# Patient Record
Sex: Female | Born: 1965 | Race: White | Hispanic: No | Marital: Married | Smoking: Never smoker
Health system: Southern US, Community
[De-identification: ages and names within clinical notes are randomized; demographics above are authoritative.]

## PROBLEM LIST (undated history)

## (undated) DIAGNOSIS — F32A Depression, unspecified: Secondary | ICD-10-CM

## (undated) DIAGNOSIS — F329 Major depressive disorder, single episode, unspecified: Secondary | ICD-10-CM

## (undated) DIAGNOSIS — N941 Unspecified dyspareunia: Secondary | ICD-10-CM

## (undated) DIAGNOSIS — E669 Obesity, unspecified: Secondary | ICD-10-CM

## (undated) DIAGNOSIS — I10 Essential (primary) hypertension: Secondary | ICD-10-CM

## (undated) DIAGNOSIS — O24419 Gestational diabetes mellitus in pregnancy, unspecified control: Secondary | ICD-10-CM

## (undated) DIAGNOSIS — D649 Anemia, unspecified: Secondary | ICD-10-CM

## (undated) DIAGNOSIS — R55 Syncope and collapse: Secondary | ICD-10-CM

## (undated) DIAGNOSIS — G43909 Migraine, unspecified, not intractable, without status migrainosus: Secondary | ICD-10-CM

## (undated) HISTORY — PX: DILATION AND CURETTAGE OF UTERUS: SHX78

## (undated) HISTORY — PX: ABDOMINAL HYSTERECTOMY: SHX81

## (undated) HISTORY — DX: Major depressive disorder, single episode, unspecified: F32.9

## (undated) HISTORY — DX: Anemia, unspecified: D64.9

## (undated) HISTORY — PX: LAPAROSCOPIC VAGINAL HYSTERECTOMY: SUR798

## (undated) HISTORY — DX: Unspecified dyspareunia: N94.10

## (undated) HISTORY — DX: Depression, unspecified: F32.A

## (undated) HISTORY — DX: Gestational diabetes mellitus in pregnancy, unspecified control: O24.419

---

## 1997-03-19 ENCOUNTER — Ambulatory Visit (HOSPITAL_COMMUNITY): Admission: RE | Admit: 1997-03-19 | Discharge: 1997-03-19 | Payer: Self-pay | Admitting: *Deleted

## 1997-06-17 ENCOUNTER — Ambulatory Visit (HOSPITAL_COMMUNITY): Admission: RE | Admit: 1997-06-17 | Discharge: 1997-06-17 | Payer: Self-pay | Admitting: Family Medicine

## 1997-06-26 ENCOUNTER — Encounter: Admission: RE | Admit: 1997-06-26 | Discharge: 1997-09-24 | Payer: Self-pay | Admitting: Neurosurgery

## 1999-04-21 ENCOUNTER — Other Ambulatory Visit: Admission: RE | Admit: 1999-04-21 | Discharge: 1999-04-21 | Payer: Self-pay | Admitting: Obstetrics and Gynecology

## 1999-08-28 ENCOUNTER — Encounter: Admission: RE | Admit: 1999-08-28 | Discharge: 1999-11-26 | Payer: Self-pay | Admitting: Physical Therapy

## 1999-10-15 ENCOUNTER — Inpatient Hospital Stay (HOSPITAL_COMMUNITY): Admission: AD | Admit: 1999-10-15 | Discharge: 1999-10-15 | Payer: Self-pay | Admitting: Obstetrics and Gynecology

## 1999-10-15 ENCOUNTER — Ambulatory Visit (HOSPITAL_COMMUNITY): Admission: RE | Admit: 1999-10-15 | Discharge: 1999-10-15 | Payer: Self-pay | Admitting: Obstetrics and Gynecology

## 1999-10-16 ENCOUNTER — Inpatient Hospital Stay (HOSPITAL_COMMUNITY): Admission: AD | Admit: 1999-10-16 | Discharge: 1999-10-19 | Payer: Self-pay | Admitting: Obstetrics and Gynecology

## 1999-10-16 ENCOUNTER — Encounter (INDEPENDENT_AMBULATORY_CARE_PROVIDER_SITE_OTHER): Payer: Self-pay | Admitting: Specialist

## 1999-11-30 ENCOUNTER — Other Ambulatory Visit: Admission: RE | Admit: 1999-11-30 | Discharge: 1999-11-30 | Payer: Self-pay | Admitting: Obstetrics and Gynecology

## 2000-05-31 ENCOUNTER — Encounter: Payer: Self-pay | Admitting: Obstetrics and Gynecology

## 2000-05-31 ENCOUNTER — Ambulatory Visit (HOSPITAL_COMMUNITY): Admission: RE | Admit: 2000-05-31 | Discharge: 2000-05-31 | Payer: Self-pay | Admitting: Obstetrics and Gynecology

## 2002-03-28 ENCOUNTER — Other Ambulatory Visit: Admission: RE | Admit: 2002-03-28 | Discharge: 2002-03-28 | Payer: Self-pay | Admitting: Obstetrics and Gynecology

## 2003-04-02 ENCOUNTER — Other Ambulatory Visit: Admission: RE | Admit: 2003-04-02 | Discharge: 2003-04-02 | Payer: Self-pay | Admitting: Obstetrics and Gynecology

## 2003-05-22 ENCOUNTER — Observation Stay (HOSPITAL_COMMUNITY): Admission: RE | Admit: 2003-05-22 | Discharge: 2003-05-23 | Payer: Self-pay | Admitting: Obstetrics and Gynecology

## 2003-05-22 ENCOUNTER — Encounter (INDEPENDENT_AMBULATORY_CARE_PROVIDER_SITE_OTHER): Payer: Self-pay | Admitting: Specialist

## 2004-09-01 ENCOUNTER — Ambulatory Visit (HOSPITAL_COMMUNITY): Admission: RE | Admit: 2004-09-01 | Discharge: 2004-09-01 | Payer: Self-pay | Admitting: *Deleted

## 2005-03-31 ENCOUNTER — Encounter: Payer: Self-pay | Admitting: Obstetrics and Gynecology

## 2006-04-13 ENCOUNTER — Ambulatory Visit (HOSPITAL_COMMUNITY): Admission: RE | Admit: 2006-04-13 | Discharge: 2006-04-13 | Payer: Self-pay | Admitting: Obstetrics and Gynecology

## 2006-04-17 ENCOUNTER — Emergency Department (HOSPITAL_COMMUNITY): Admission: EM | Admit: 2006-04-17 | Discharge: 2006-04-17 | Payer: Self-pay | Admitting: Family Medicine

## 2006-04-21 ENCOUNTER — Encounter (INDEPENDENT_AMBULATORY_CARE_PROVIDER_SITE_OTHER): Payer: Self-pay | Admitting: Specialist

## 2006-04-21 ENCOUNTER — Ambulatory Visit (HOSPITAL_COMMUNITY): Admission: RE | Admit: 2006-04-21 | Discharge: 2006-04-22 | Payer: Self-pay | Admitting: Obstetrics and Gynecology

## 2007-04-25 ENCOUNTER — Ambulatory Visit (HOSPITAL_COMMUNITY): Admission: RE | Admit: 2007-04-25 | Discharge: 2007-04-25 | Payer: Self-pay | Admitting: Obstetrics and Gynecology

## 2007-05-07 ENCOUNTER — Emergency Department (HOSPITAL_COMMUNITY): Admission: EM | Admit: 2007-05-07 | Discharge: 2007-05-07 | Payer: Self-pay | Admitting: Family Medicine

## 2008-06-16 ENCOUNTER — Ambulatory Visit (HOSPITAL_COMMUNITY): Admission: RE | Admit: 2008-06-16 | Discharge: 2008-06-16 | Payer: Self-pay | Admitting: Obstetrics and Gynecology

## 2008-06-18 ENCOUNTER — Encounter: Admission: RE | Admit: 2008-06-18 | Discharge: 2008-06-18 | Payer: Self-pay | Admitting: Obstetrics and Gynecology

## 2009-01-21 ENCOUNTER — Emergency Department (HOSPITAL_COMMUNITY): Admission: EM | Admit: 2009-01-21 | Discharge: 2009-01-21 | Payer: Self-pay | Admitting: Emergency Medicine

## 2009-07-02 ENCOUNTER — Ambulatory Visit (HOSPITAL_COMMUNITY): Admission: RE | Admit: 2009-07-02 | Discharge: 2009-07-02 | Payer: Self-pay | Admitting: Obstetrics and Gynecology

## 2010-01-26 ENCOUNTER — Emergency Department (HOSPITAL_COMMUNITY)
Admission: EM | Admit: 2010-01-26 | Discharge: 2010-01-26 | Payer: Self-pay | Source: Home / Self Care | Admitting: Emergency Medicine

## 2010-02-21 ENCOUNTER — Other Ambulatory Visit (HOSPITAL_COMMUNITY): Payer: Self-pay | Admitting: Obstetrics and Gynecology

## 2010-02-21 DIAGNOSIS — Z1239 Encounter for other screening for malignant neoplasm of breast: Secondary | ICD-10-CM

## 2010-02-22 ENCOUNTER — Encounter: Payer: Self-pay | Admitting: Internal Medicine

## 2010-02-23 ENCOUNTER — Encounter: Payer: Self-pay | Admitting: Obstetrics and Gynecology

## 2010-06-19 NOTE — Discharge Summary (Signed)
NAMEKOLBIE, CLARKSTON            ACCOUNT NO.:  [id number]   MEDICAL RECORD NO.:  [id number]          PATIENT TYPE:  OIB   LOCATION:  9319                          [facility]ACILITY:  [facility]   PHYSICIAN:  Duke Salvia. Marcelle Overlie, M.D.DATE OF BIRTH:  Jul 13, 1965   DATE OF ADMISSION:  04/21/2006  DATE OF DISCHARGE:  04/22/2006                               DISCHARGE SUMMARY   DISCHARGE DIAGNOSES:  1. Continued vaginal bleeding, status post laparoscopic supracervical      hysterectomy.  2. Laparoscopy with lysis of adhesions and removal of cervical stump      vaginally this admission.   SUMMARY OF HISTORY AND PHYSICAL EXAMINATION:  Please see admission  H&P  for details.  Briefly, a 45 year old status post LSH two years ago who  has had bothersome monthly bleeding, presents for removal of the  cervical stump.   HOSPITAL COURSE:  On April 21, 2006, under general anesthesia, patient  underwent laparoscopy with lysis of adhesions and vaginal removal of the  cervical stump.  She was observed overnight.  Her preoperative  hemoglobin was 13.5.  Postoperatively, that evening on April 21, 2006,  was 10.7 with wbc of 6100.  Remainder of her lab studies, UPT was  negative.  The following a.m. she was afebrile, voiding without  difficulty.  The catheter was removed the evening before.  Her abdominal  exam was unremarkable.  She was afebrile and ready for discharge at that  point.   DISPOSITION:  Patient discharged on Tylox p.r.n. pain.  Will return to  the office in a week to 10 days.  Advised to report any incisional  redness or drainage, increased pain or bleeding or fever over 101.  She  was given specific instructions regarding diet, sex, exercise.   CONDITION:  Good.   ACTIVITY:  Graded increase.      Richard M. Marcelle Overlie, M.D.  Electronically Signed     RMH/MEDQ  D:  04/22/2006  T:  04/22/2006  Job:  [id number]

## 2010-06-19 NOTE — Op Note (Signed)
Cheryl Mayer, Cheryl Mayer                      ACCOUNT NO.:  [id number]   MEDICAL RECORD NO.:  [id number]                   PATIENT TYPE:  OBV   LOCATION:  9399                                 [facility]ILITY[facility]   PHYSICIAN:  Duke Salvia. Marcelle Overlie, M.D.            DATE OF BIRTH:  December 08, 1965   DATE OF PROCEDURE:  05/22/2003  DATE OF DISCHARGE:                                 OPERATIVE REPORT   PREOPERATIVE DIAGNOSES:  1. Dysmenorrhea.  2. Abnormal uterine bleeding.   POSTOPERATIVE DIAGNOSES:  1. Dysmenorrhea.  2. Abnormal uterine bleeding.   PROCEDURE:  Laparoscopic supracervical hysterectomy.   SURGEON:  Duke Salvia. Marcelle Overlie, M.D.   ASSISTANT:  Tracie Harrier, M.D.   ANESTHESIA:  General endotracheal.   COMPLICATIONS:  None.   DRAINS:  Foley catheter.   ESTIMATED BLOOD LOSS:  150.   PROCEDURE AND FINDINGS:  The patient went to the operating room.  After an  adequate level of general endotracheal anesthesia was obtained with the  patient's legs in stirrups, the abdomen, perineum, and vagina were prepped  and draped in the usual manner for [facility].  The bladder was drained with a  Foley catheter, uterus midposition and normal size, adnexa negative.  The  sound was placed and taped to a single-tooth tenaculum to manipulate and  identify the endocervical canal.  Attention directed to the abdomen, where  the subumbilical area was infiltrated with 0.25% Marcaine plain.  A small  incision was made and the Veress needle was introduced without difficulty.  Its intra-abdominal position was verified by pressure and water testing.  After a 2.5 L pneumoperitoneum was then created, the laparoscopic trocar and  sleeve were then introduced without difficulty.  With the patient in slight  Trendelenburg, the left lower quadrant was transilluminated just medial to  the iliac crest.  A small incision was made and a 10 mm bladeless trocar was  inserted under direct visualization after negative  transillumination, the  exact same repeated on the opposite side.  After this was completed, the  patient was then placed in Trendelenburg.  The uterus was anteflexed.  The  pelvic findings as follows:   Anterior and posterior spaces were unremarkable.  The uterus itself was  normal size, the adnexa unremarkable.  The LCS was then brought into the  operating scope and the utero-ovarian pedicle was coagulated and divided  down to and including the round ligament.  The LCS was then brought into the  lower incision and the peritoneum across the bladder flap area was incised  and with blunt dissection the bladder was dissected below.  The LCS was then  brought into the right lower quadrant and the ascending branch of the  uterine artery on the right close to the uterus was coagulated in two  separate areas and then divided, the exact same repeated on the opposite  side.  Using the LCS, the fundus of the uterus was then divided  from the  cervix until the sound could be identified.  Once the specimen was removed,  the endocervical canal was coagulated on low power in a circumferential  manner with the LCS.  In the left lower quadrant the morcellator was brought  into position and morcellation was carried out without difficulty.  After  this was completed, the pelvis was irrigated with saline copiously and  irrigated.  All the operative sites were noted to be hemostatic.  Interceed  was placed across the cervical stump.  The left lower quadrant incision  fascia was closed with the fascial closure device with 2-0 Vicryl suture.  Instruments were removed, gas allowed to escape, defects closed with 4-0  Dexon suture subcuticular and Dermabond.  Clear urine noted at the end of  the case.  She tolerated this well, went to the recovery room in good  condition.                                               Richard M. Marcelle Overlie, M.D.    RMH/MEDQ  D:  05/22/2003  T:  05/23/2003  Job:  [id number]

## 2010-06-19 NOTE — H&P (Signed)
Cheryl Mayer, Cheryl Mayer            ACCOUNT NO.:  [id number]   MEDICAL RECORD NO.:  [id number]          PATIENT TYPE:  AMB   LOCATION:  SDC                           [facility]   PHYSICIAN:  Duke Salvia. Marcelle Overlie, M.D.DATE OF BIRTH:  1965-09-09   DATE OF ADMISSION:  04/21/2006  DATE OF DISCHARGE:                              HISTORY & PHYSICAL   CHIEF COMPLAINT:  Vaginal bleeding.   HISTORY OF PRESENT ILLNESS:  A 45 year old G5, P3.  In 2005 this patient  underwent LSH after having problems with bleeding and dysmenorrhea.  She  had improvement initially but continued to have cyclic monthly bleeding  that has not been acceptable for her, although improved.  She presents  now for removal of the cervical stump via a laparoscopic/vaginal  approach.  This procedure including the risk of bleeding, infection,  adjacent organ injury, expected recovery time, all reviewed with her,  which she understands and accepts.  Additionally, we have reviewed the  possibility of significant bowel adhesions to the cervical stump that  may require open laparotomy with excision from above.  All of her  questions were answered.   PAST MEDICAL HISTORY:  Allergies:  None.   Current medications:  None.   Operations:  LSH in 2005.   She has had 3 children delivered vaginally.  She did have gestational  diabetes.   FAMILY HISTORY:  Significant for mother and sister and father with  diabetes.  A history of herniated lower back disks.   PHYSICAL EXAMINATION:  VITAL SIGNS:  Temperature 98.3, blood pressure  110/72.  HEENT:  Unremarkable.  NECK:  Supple without masses.  LUNGS:  Clear.  CARDIOVASCULAR:  Regular rate and rhythm without murmurs, rubs or  gallops.  BREASTS:  Without masses.  ABDOMEN:  Soft, flat and nontender.  PELVIC:  Normal external genitalia.  Vagina clear.  The cervical stump  was normal.  The fundus was absent.  Adnexa negative.  EXTREMITIES:  Unremarkable.  NEUROLOGIC:   Unremarkable.   IMPRESSION:  Continued bleeding, status post LSH.   PLAN:  Laparoscopic/vaginal removal of cervical stump.  Procedure and  risks reviewed as above.      Richard M. Marcelle Overlie, M.D.  Electronically Signed     RMH/MEDQ  D:  04/19/2006  T:  04/19/2006  Job:  [id number]

## 2010-06-19 NOTE — Op Note (Signed)
Cheryl Mayer, Cheryl Mayer            ACCOUNT NO.:  [id number]   MEDICAL RECORD NO.:  [id number]          PATIENT TYPE:  AMB   LOCATION:  SDC                           [facility]   PHYSICIAN:  Duke Salvia. Marcelle Overlie, M.D.DATE OF BIRTH:  April 12, 1965   DATE OF PROCEDURE:  04/21/2006  DATE OF DISCHARGE:                               OPERATIVE REPORT   PREOPERATIVE DIAGNOSIS:  Continued bleeding status post laparoscopic  supracervical hysterectomy.   POSTOPERATIVE DIAGNOSES:  1. Continued bleeding status post laparoscopic supracervical      hysterectomy.  2. Pelvic adhesions.   PROCEDURE:  Diagnostic laparoscopy, lysis of adhesions followed by  vaginal removal of cervical stump.   SPECIMENS REMOVED:  Cervix.   ESTIMATED BLOOD LOSS:  200   SURGEON:  [place]   ANESTHESIA:  General endotracheal.   COMPLICATIONS:  None.   DRAINS:  Foley catheter.   PROCEDURE AND FINDINGS:  The patient taken to the operating room.  After  an adequate level of general endotracheal was obtained with the  patient's legs in stirrups, the abdomen, perineum, and vagina prepped  and draped in usual manner for laparoscopy.  The pelvic exam revealed a  small cervical stump.  Adnexa negative.  Bladder was drained with a  catheter, in-and-out.  Attention directed to the abdomen.  The  subumbilical area was infiltrated with 0.5% Marcaine plain.  Small  incision was made.  The Veress needle was introduced without difficulty,  its intra-abdominal position verified by pressure and water testing.  After 2.5 L pneumoperitoneum seen created, laparoscopic trocar and  sleeve were then introduced without difficulty.  There was no evidence  of any bleeding or trauma.  Three fingerbreadths above the symphysis in  the midline, a 5 mm trocar was inserted under direct visualization.  The  patient then placed in Trendelenburg.  Pelvic findings as follows:  Right tube and ovary were normal.  The left ovary was likewise  normal.  There were some filmy adhesions, and epiploica from the sigmoid colon  were adherent to the cervical stump from within.  These could be placed  on tension with an atraumatic grasping forceps using scissors or gyrus  PK for coagulation and cutting in an avascular plane.  These adhesions  were dissected off of the cervical stump.  There was no bowel  involvement in this area upon careful dissection.  This was then  irrigated and aspirated with the [place], and the top of the cervical  stump was free and clear as was the posterior cul-de-sac.  Attention  directed to the vaginal portion of the procedure.  The legs were  extended, cervix grasped with a tenaculum.  Weighted speculum was  positioned.  The cervical vaginal mucosa was scored with the Bovie,  posterior colpotomy performed without difficulty.  The bladder was  advanced superiorly with sharp and blunt dissection.  The surgeon's  finger was passed around the back of the cervical stump to isolate the  anterior reflection which was entered sharply.  Retractor was then used  to gently elevate the bladder out of the field.  In sequential manner,  the uterosacral  ligament was clamped, divided, and suture ligated in  Heaney fashion with 0 Vicryl suture.  The remaining short cervical stump  pedicle was then clamped, divided, and free tied with 0 Vicryl suture,  and the stump was removed.  The vaginal cuff was then reapproximated  with interrupted figure-of-eight 2-0 Vicryl sutures for hemostasis.  Prior to closure, sponge, needle, instrument counts were reported as  correct x2.  Vaginal mucosa was then closed right-to-left with  interrupted 2-0 Monocryl sutures with excellent hemostasis, Foley  catheter positioned draining clear urine.  Repeat laparoscopy was then  carried out.  The pelvis was irrigated with saline with the patient in  Trendelenburg and aspirated.  Pressure was reduced, and the operative  site was inspected.  There  was no evidence of any bleeding.  After  assuring hemostasis, the instruments were removed, gas allowed to  escape.  Defect was closed with 4-0 Dexon subcuticular sutures and  Dermabond.  She tolerated this well, went to recovery room in good  condition.  Clear urine noted at the end of the case.      Richard M. Marcelle Overlie, M.D.  Electronically Signed     RMH/MEDQ  D:  04/21/2006  T:  04/21/2006  Job:  [id number]

## 2010-06-19 NOTE — Discharge Summary (Signed)
NAMEPRESTINA, Cheryl Mayer                      ACCOUNT NO.:  [id number]   MEDICAL RECORD NO.:  [id number]                   PATIENT TYPE:  OBV   LOCATION:  9317                                 [facility]   PHYSICIAN:  Duke Salvia. Marcelle Overlie, M.D.            DATE OF BIRTH:  12/19/1965   DATE OF ADMISSION:  05/22/2003  DATE OF DISCHARGE:                                 DISCHARGE SUMMARY   DISCHARGE DIAGNOSES:  1. Pelvic pain, dysmenorrhea, abnormal bleeding.  2. Laparoscopic supracervical hysterectomy this admission.   SUMMARY OF THE HISTORY AND PHYSICAL EXAM:  Please see admission H&P for  details.  Briefly, a 45 year old who presents for [facility] secondary to  dysmenorrhea and persistent abnormal uterine bleeding.   HOSPITAL COURSE:  On May 22, 2003 under general anesthesia the patient  underwent LSH with an EBL of 150 mL.  On the evening of surgery her catheter  was removed.  She was voiding without difficulty, remained afebrile.  On  postoperative day #1 hemoglobin returned 9.6, she was afebrile, ambulating,  with a normal abdominal exam, and was ready for discharge at that point.   LABORATORY DATA:  Blood type is A positive.  Preoperative hemoglobin 13.1.  UA was negative.  Postoperative hemoglobin on April 21:  WBC 7.5, hemoglobin  9.6.   DISPOSITION:  The patient discharged on Tylox p.r.n. pain, Hemocyte once  daily.  Will return to the office in 1 week.  Advised to report any  increased vaginal bleeding, fever over 101, urinary complaints, persistent  nausea or vomiting.  She was given specific instructions regarding diet,  sex, and exercise.   CONDITION:  Good.   ACTIVITY:  Graded increase.  Will return to our office in 1 week.                                               Richard M. Marcelle Overlie, M.D.    RMH/MEDQ  D:  05/23/2003  T:  05/23/2003  Job:  [id number]

## 2010-06-19 NOTE — H&P (Signed)
Cheryl Mayer, Cheryl Mayer                      ACCOUNT NO.:  [id number]   MEDICAL RECORD NO.:  [id number]                   PATIENT TYPE:  OBV   LOCATION:  NA                                   [facility]   PHYSICIAN:  Duke Salvia. Marcelle Overlie, M.D.            DATE OF BIRTH:  04-Sep-1965   DATE OF ADMISSION:  05/22/2003  DATE OF DISCHARGE:                                HISTORY & PHYSICAL   CHIEF COMPLAINT:  Dysmenorrhea, pelvic pain.   HISTORY OF PRESENT ILLNESS:  A 45 year old G5, P3.  Her husband has had a  vasectomy.  She has had problematic irregular bleeding for over a year.  We  have tried different OCPs without significant control.  Ultrasound done  earlier this year did not reveal any structural abnormalities.  She presents  now for [facility].  This procedure, including risks of bleeding, infection,  transfusion, adjacent organ injury with the possible need for open or  additional surgery, are all reviewed with her.  Other risks regarding wound  infection, phlebitis, her expected recovery time, and the fact that she  would still need annual cytology and may still experience some occasional  light spotting, were reviewed with her, all of which she understands and  accepts.   PAST MEDICAL HISTORY:  Allergies:  None.   Operations:  None.   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  She  did have gestational diabetes during pregnancy.   FAMILY HISTORY:  Significant for diabetes in father and mother and sister,  all of which are on oral medications.   PAST OBSTETRICAL HISTORY:  Three vaginal deliveries at term, two SABs  requiring D&E.   PHYSICAL EXAMINATION:  VITAL SIGNS:  Temperature 98.2, blood pressure  150/90.  HEENT:  Unremarkable.  NECK:  Supple without mass.  CHEST:  Lungs clear.  CARDIOVASCULAR:  Regular rate and rhythm without murmurs, rubs, or gallops.  BREASTS:  Without masses.  ABDOMEN:  Soft, flat, and nontender.  PELVIC:  Normal  external genitalia.  Vagina and cervix clear.  Uterus  midposition, normal size.  Adnexa negative.  EXTREMITIES:  Unremarkable.  NEUROLOGIC:  Unremarkable.   IMPRESSION:  Abnormal uterine bleeding/dysmenorrhea unresponsive to  conservative measures.   PLAN:  LSH.  Procedure and risks reviewed as above.                                              Richard M. Marcelle Overlie, M.D.   RMH/MEDQ  D:  05/21/2003  T:  05/21/2003  Job:  [id number]

## 2010-07-21 ENCOUNTER — Ambulatory Visit (HOSPITAL_COMMUNITY)
Admission: RE | Admit: 2010-07-21 | Discharge: 2010-07-21 | Disposition: A | Discharge status: Home / Self Care | Payer: BC Managed Care – PPO | Source: Ambulatory Visit | Attending: Obstetrics and Gynecology | Admitting: Obstetrics and Gynecology

## 2010-07-21 ENCOUNTER — Other Ambulatory Visit (HOSPITAL_COMMUNITY): Payer: Self-pay | Admitting: Obstetrics and Gynecology

## 2010-07-21 DIAGNOSIS — Z1231 Encounter for screening mammogram for malignant neoplasm of breast: Secondary | ICD-10-CM | POA: Insufficient documentation

## 2010-10-27 LAB — POCT URINALYSIS DIP (DEVICE)
Bilirubin Urine: NEGATIVE
Glucose, UA: NEGATIVE
Ketones, ur: NEGATIVE
Nitrite: NEGATIVE
Operator id: 200941
Protein, ur: NEGATIVE
Specific Gravity, Urine: 1.015
Urobilinogen, UA: 0.2
pH: 7.5

## 2010-10-27 LAB — URINE CULTURE: Colony Count: >=100000

## 2010-10-27 LAB — POCT PREGNANCY, URINE
Operator id: 200941
Preg Test, Ur: NEGATIVE

## 2011-09-15 ENCOUNTER — Other Ambulatory Visit (HOSPITAL_COMMUNITY): Payer: Self-pay | Admitting: Obstetrics and Gynecology

## 2011-09-15 ENCOUNTER — Ambulatory Visit (HOSPITAL_COMMUNITY)
Admission: RE | Admit: 2011-09-15 | Discharge: 2011-09-15 | Disposition: A | Discharge status: Home / Self Care | Payer: BC Managed Care – PPO | Source: Ambulatory Visit | Attending: Obstetrics and Gynecology | Admitting: Obstetrics and Gynecology

## 2011-09-15 DIAGNOSIS — Z1231 Encounter for screening mammogram for malignant neoplasm of breast: Secondary | ICD-10-CM

## 2011-09-19 ENCOUNTER — Ambulatory Visit (HOSPITAL_COMMUNITY)
Admission: RE | Admit: 2011-09-19 | Discharge: 2011-09-19 | Disposition: A | Discharge status: Home / Self Care | Payer: BC Managed Care – PPO | Source: Ambulatory Visit | Attending: Obstetrics and Gynecology | Admitting: Obstetrics and Gynecology

## 2011-09-19 DIAGNOSIS — Z1231 Encounter for screening mammogram for malignant neoplasm of breast: Secondary | ICD-10-CM | POA: Insufficient documentation

## 2011-09-20 ENCOUNTER — Other Ambulatory Visit (HOSPITAL_COMMUNITY): Payer: Self-pay | Admitting: Obstetrics and Gynecology

## 2011-09-20 DIAGNOSIS — Z1231 Encounter for screening mammogram for malignant neoplasm of breast: Secondary | ICD-10-CM

## 2012-09-19 ENCOUNTER — Other Ambulatory Visit (HOSPITAL_COMMUNITY): Payer: Self-pay | Admitting: Family Medicine

## 2012-09-19 ENCOUNTER — Ambulatory Visit (HOSPITAL_COMMUNITY)
Admission: RE | Admit: 2012-09-19 | Discharge: 2012-09-19 | Disposition: A | Discharge status: Home / Self Care | Payer: BC Managed Care – PPO | Source: Ambulatory Visit | Attending: Family Medicine | Admitting: Family Medicine

## 2012-09-19 DIAGNOSIS — Z1231 Encounter for screening mammogram for malignant neoplasm of breast: Secondary | ICD-10-CM

## 2013-05-23 ENCOUNTER — Emergency Department (HOSPITAL_COMMUNITY)
Admission: EM | Admit: 2013-05-23 | Discharge: 2013-05-23 | Disposition: A | Discharge status: Home / Self Care | Payer: BC Managed Care – PPO | Attending: Emergency Medicine | Admitting: Emergency Medicine

## 2013-05-23 ENCOUNTER — Emergency Department (HOSPITAL_COMMUNITY): Payer: BC Managed Care – PPO

## 2013-05-23 ENCOUNTER — Encounter (HOSPITAL_COMMUNITY): Payer: Self-pay | Admitting: Emergency Medicine

## 2013-05-23 DIAGNOSIS — Y9389 Activity, other specified: Secondary | ICD-10-CM | POA: Insufficient documentation

## 2013-05-23 DIAGNOSIS — Y9289 Other specified places as the place of occurrence of the external cause: Secondary | ICD-10-CM | POA: Insufficient documentation

## 2013-05-23 DIAGNOSIS — S93401A Sprain of unspecified ligament of right ankle, initial encounter: Secondary | ICD-10-CM

## 2013-05-23 DIAGNOSIS — W010XXA Fall on same level from slipping, tripping and stumbling without subsequent striking against object, initial encounter: Secondary | ICD-10-CM | POA: Insufficient documentation

## 2013-05-23 DIAGNOSIS — S93409A Sprain of unspecified ligament of unspecified ankle, initial encounter: Secondary | ICD-10-CM | POA: Insufficient documentation

## 2013-05-23 NOTE — ED Notes (Signed)
Pt reports that she tripped and fell, twisting her R ankle. Pt noted to have swelling to her R ankle, pt able to ambulate on R foot, but reports pain has been increasing since her fall at 1430 today. Pt A&o x4, NAD noted at this time.

## 2013-05-23 NOTE — ED Provider Notes (Signed)
Medical screening examination/treatment/procedure(s) were performed by non-physician practitioner and as supervising physician I was immediately available for consultation/collaboration.   EKG Interpretation None       Virgel Manifold, MD 05/23/13 2325

## 2013-05-23 NOTE — Discharge Instructions (Signed)
Ankle Sprain An ankle sprain is an injury to the strong, fibrous tissues (ligaments) that hold your ankle bones together.  HOME CARE   Put ice on your ankle for 1 2 days or as told by your doctor.  Put ice in a plastic bag.  Place a towel between your skin and the bag.  Leave the ice on for 15-20 minutes at a time, every 2 hours while you are awake.  Only take medicine as told by your doctor.  Raise (elevate) your injured ankle above the level of your heart as much as possible for 2 3 days.  Use crutches if your doctor tells you to. Slowly put your own weight on the affected ankle. Use the crutches until you can walk without pain.  If you have a plaster splint:  Do not rest it on anything harder than a pillow for 24 hours.  Do not put weight on it.  Do not get it wet.  Take it off to shower or bathe.  If given, use an elastic wrap or support stocking for support. Take the wrap off if your toes lose feeling (numb), tingle, or turn cold or blue.  If you have an air splint:  Add or let out air to make it comfortable.  Take it off at night and to shower and bathe.  Wiggle your toes and move your ankle up and down often while you are wearing it. GET HELP RIGHT AWAY IF:   Your toes lose feeling (numb) or turn blue.  You have severe pain that is increasing.  You have rapidly increasing bruising or puffiness (swelling).  Your toes feel very cold.  You lose feeling in your foot.  Your medicine does not help your pain. MAKE SURE YOU:   Understand these instructions.  Will watch your condition.  Will get help right away if you are not doing well or get worse. Document Released: 07/07/2007 Document Revised: 10/13/2011 Document Reviewed: 08/02/2011 [facility] Patient Information 2014 [place], [place]. RICE: Routine Care for Injuries Rest, Ice, Compression, and Elevation (RICE) are often used to care for injuries. HOME CARE  Rest your injury.  Put ice on the  injury.  Put ice in a plastic bag.  Place a towel between your skin and the bag.  Leave the ice on for 15-20 minutes, 03-04 times a day. Do this for as long as told by your doctor.  Apply pressure (compression) with an elastic bandage. Remove and reapply the bandage every 3 to 4 hours. Do not wrap the bandage too tight. Wrap the bandage looser if the fingers or toes are puffy (swollen), blue, cold, painful, or lose feeling (numb).  Raise (elevate) your injury. Raise your injury above the heart if you can. GET HELP RIGHT AWAY IF:  You have lasting pain or puffiness.  Your injury is red, weak, or loses feeling.  Your problems get worse, not better, after several days. MAKE SURE YOU:  Understand these instructions.  Will watch your condition.  Will get help right away if you are not doing well or get worse. Document Released: 07/07/2007 Document Revised: 04/12/2011 Document Reviewed: 06/19/2010 [facility] Patient Information 2014 [place], [place].

## 2013-05-23 NOTE — ED Provider Notes (Signed)
CSN: [id number]     Arrival date & time 05/23/13  2019 History  This chart was scribed for non-physician practitioner Antonietta Breach, PA-C working with Virgel Manifold, MD by Eston Mould, ED Scribe. This patient was seen in room WTR6/WTR6 and the patient's care was started at 9:22 PM .   Chief Complaint  Patient presents with  . Ankle Injury   The history is provided by the patient. No language interpreter was used.   HPI Comments: Cheryl Mayer is a 48 y.o. female who presents to the Emergency Department complaining of R ankle injury with associated swelling that occurred today 7 hours ago. Pt states she tripped, fell outdoors and believes she twisted her R ankle. She states she was able to walk post fall. Certain ROM and propping R ankle up worsens the pain. Pt states the pain is constant in the ankle. Pt denies LOC and any other injuries.  History reviewed. No pertinent past medical history. Past Surgical History  Procedure Laterality Date  . Abdominal hysterectomy     History reviewed. No pertinent family history. History  Substance Use Topics  . Smoking status: Never Smoker   . Smokeless tobacco: Never Used  . Alcohol Use: No   OB History   Grav Para Term Preterm Abortions TAB SAB Ect Mult Living                 Review of Systems  Cardiovascular: Negative for leg swelling.  Musculoskeletal: Positive for arthralgias, joint swelling and myalgias. Negative for gait problem.  Skin: Negative for color change and wound.  Neurological: Negative for syncope and weakness.  All other systems reviewed and are negative.   Allergies  Review of patient's allergies indicates no known allergies.  Home Medications   Prior to Admission medications   Not on File   BP 186/81  Pulse 89  Temp(Src) 98.7 F (37.1 C) (Oral)  Resp 16  Ht 5\' 4"  (1.626 m)  Wt 180 lb (81.647 kg)  BMI 30.88 kg/m2  SpO2 100%  Physical Exam  Nursing note and vitals reviewed. Constitutional:  She is oriented to person, place, and time. She appears well-developed and well-nourished. No distress.  HENT:  Head: Normocephalic and atraumatic.  Eyes: Conjunctivae and EOM are normal. No scleral icterus.  Neck: Normal range of motion.  Cardiovascular: Normal rate, regular rhythm and intact distal pulses.   Pulses:      Dorsalis pedis pulses are 2+ on the right side.       Posterior tibial pulses are 2+ on the right side.  Pulmonary/Chest: Effort normal. No respiratory distress.  Musculoskeletal: She exhibits tenderness.       Right ankle: She exhibits decreased range of motion (Mild, secondary to pain) and swelling. She exhibits no deformity and normal pulse. Tenderness. Lateral malleolus tenderness found. Achilles tendon normal.       Right lower leg: Normal.       Right foot: Normal.       Feet:  Tenderness and swelling to the lateral malleolus.  Neurological: She is alert and oriented to person, place, and time.  Reflex Scores:      Patellar reflexes are 2+ on the right side.      Achilles reflexes are 2+ on the right side. No numbness, tingling or weakness of the affected extremity. Patient able to wiggle all toes of right foot.  Skin: Skin is warm and dry. No rash noted. She is not diaphoretic. No erythema. No pallor.  Psychiatric:  She has a normal mood and affect. Her behavior is normal.    ED Course  Procedures  DIAGNOSTIC STUDIES: Oxygen Saturation is 100% on RA, normal by my interpretation.    COORDINATION OF CARE: 9:25 PM-Discussed treatment plan which includes discussed radiology findings with patient. Will discharge pt with a brace and crutches. Advised pt to rest, ice, elevation, and take Ibuprofen PRN. Advised pt to F/U with her PCP. Pt agreed to plan.   Labs Review Labs Reviewed - No data to display  Imaging Review Dg Ankle Complete Right  05/23/2013   CLINICAL DATA:  Traumatic injury with pain  EXAM: RIGHT ANKLE - COMPLETE 3+ VIEW  COMPARISON:  None.   FINDINGS: Considerable soft tissue swelling is identified. No acute fracture or dislocation is noted. Large Achilles and plantar spurs are noted.  IMPRESSION: Degenerative change without acute abnormality.   Electronically Signed   By: Inez Catalina M.D.   On: 05/23/2013 21:09     EKG Interpretation None     MDM   Final diagnoses:  Right ankle sprain    Uncomplicated right ankle sprain. Patient neurovascularly intact. No gross sensory deficits appreciated. Tenderness and swelling appreciated to the lateral malleolus. No evidence of septic joint. X-ray negative for fracture or dislocation; no acute bony abnormalities visualized. Patient provided ASO ankle and crutches. She is stable for discharge with RICE instruction. Ibuprofen advised for pain control. Return precautions discussed and patient agreeable to plan with no unaddressed concerns.  I personally performed the services described in this documentation, which was scribed in my presence. The recorded information has been reviewed and is accurate.     Antonietta Breach, PA-C 05/23/13 2136

## 2014-08-02 DIAGNOSIS — F32A Depression, unspecified: Secondary | ICD-10-CM | POA: Insufficient documentation

## 2014-08-02 DIAGNOSIS — F988 Other specified behavioral and emotional disorders with onset usually occurring in childhood and adolescence: Secondary | ICD-10-CM | POA: Insufficient documentation

## 2014-08-02 DIAGNOSIS — N951 Menopausal and female climacteric states: Secondary | ICD-10-CM | POA: Insufficient documentation

## 2014-08-02 DIAGNOSIS — L301 Dyshidrosis [pompholyx]: Secondary | ICD-10-CM | POA: Insufficient documentation

## 2014-08-02 DIAGNOSIS — G43909 Migraine, unspecified, not intractable, without status migrainosus: Secondary | ICD-10-CM | POA: Insufficient documentation

## 2014-08-02 DIAGNOSIS — E663 Overweight: Secondary | ICD-10-CM | POA: Insufficient documentation

## 2014-08-02 DIAGNOSIS — F329 Major depressive disorder, single episode, unspecified: Secondary | ICD-10-CM | POA: Insufficient documentation

## 2014-12-06 ENCOUNTER — Other Ambulatory Visit: Payer: Self-pay

## 2014-12-06 ENCOUNTER — Ambulatory Visit
Admission: RE | Admit: 2014-12-06 | Discharge: 2014-12-06 | Disposition: A | Discharge status: Home / Self Care | Payer: BLUE CROSS/BLUE SHIELD | Source: Ambulatory Visit

## 2014-12-06 DIAGNOSIS — Z1231 Encounter for screening mammogram for malignant neoplasm of breast: Secondary | ICD-10-CM

## 2015-05-21 ENCOUNTER — Ambulatory Visit (HOSPITAL_COMMUNITY)
Admission: EM | Admit: 2015-05-21 | Discharge: 2015-05-21 | Disposition: A | Discharge status: Home / Self Care | Payer: BLUE CROSS/BLUE SHIELD | Attending: Family Medicine | Admitting: Family Medicine

## 2015-05-21 ENCOUNTER — Encounter (HOSPITAL_COMMUNITY): Payer: Self-pay | Admitting: Emergency Medicine

## 2015-05-21 DIAGNOSIS — J302 Other seasonal allergic rhinitis: Secondary | ICD-10-CM

## 2015-05-21 MED ORDER — TRIAMCINOLONE ACETONIDE 40 MG/ML IJ SUSP
40.0000 mg | Freq: Once | INTRAMUSCULAR | Status: AC
  Administered 2015-05-21: 40 mg via INTRAMUSCULAR

## 2015-05-21 MED ORDER — FEXOFENADINE HCL 180 MG PO TABS: 180.0000 mg | ORAL_TABLET | Freq: Every day | ORAL | Status: DC

## 2015-05-21 MED ORDER — PREDNISONE 50 MG PO TABS: ORAL_TABLET | ORAL | Status: DC

## 2015-05-21 MED ORDER — TRIAMCINOLONE ACETONIDE 40 MG/ML IJ SUSP
INTRAMUSCULAR | Status: AC
  Filled 2015-05-21: qty 1

## 2015-05-21 MED ORDER — FLUTICASONE PROPIONATE 50 MCG/ACT NA SUSP: 1.0000 | Freq: Two times a day (BID) | NASAL | Status: DC

## 2015-05-21 NOTE — ED Notes (Signed)
Cough, sob, headache, body aches that started saturday

## 2015-05-21 NOTE — ED Provider Notes (Addendum)
CSN: [id number]     Arrival date & time 05/21/15  1702 History   First MD Initiated Contact with Patient 05/21/15 1822     Chief Complaint  Patient presents with  . Headache  . Cough   (Consider location/radiation/quality/duration/timing/severity/associated sxs/prior Treatment) Patient is a 50 y.o. female presenting with URI. The history is provided by the patient.  URI Presenting symptoms: congestion, cough and rhinorrhea   Severity:  Mild Onset quality:  Gradual Duration:  3 days Progression:  Unchanged Chronicity:  New Relieved by:  None tried Worsened by:  Nothing tried Ineffective treatments:  None tried Associated symptoms: headaches and sneezing   Associated symptoms: no swollen glands and no wheezing     History reviewed. No pertinent past medical history. Past Surgical History  Procedure Laterality Date  . Abdominal hysterectomy     No family history on file. Social History  Substance Use Topics  . Smoking status: Never Smoker   . Smokeless tobacco: Never Used  . Alcohol Use: No   OB History    No data available     Review of Systems  Constitutional: Negative.   HENT: Positive for congestion, postnasal drip, rhinorrhea and sneezing.   Respiratory: Positive for cough. Negative for shortness of breath and wheezing.   Cardiovascular: Negative.   Gastrointestinal: Negative.   Neurological: Positive for headaches.  All other systems reviewed and are negative.   Allergies  Review of patient's allergies indicates no known allergies.  Home Medications   Prior to Admission medications   Medication Sig Start Date End Date Taking? Authorizing Provider  acetaminophen (TYLENOL) 325 MG tablet Take 650 mg by mouth every 6 (six) hours as needed.   Yes Historical Provider, MD  ibuprofen (ADVIL,MOTRIN) 200 MG tablet Take 200 mg by mouth every 6 (six) hours as needed.   Yes Historical Provider, MD  PHENTERMINE HCL PO Take by mouth.   Yes Historical Provider, MD   Pseudoephedrine-APAP-DM (DAYQUIL PO) Take by mouth.   Yes Historical Provider, MD  Topiramate (TOPAMAX PO) Take by mouth.   Yes Historical Provider, MD  fexofenadine (ALLEGRA) 180 MG tablet Take 1 tablet (180 mg total) by mouth daily. 05/21/15   Billy Fischer, MD  fluticasone (FLONASE) 50 MCG/ACT nasal spray Place 1 spray into both nostrils 2 (two) times daily. 05/21/15   Billy Fischer, MD  predniSONE (DELTASONE) 50 MG tablet 1 tab daily for 2 days then 1/2 tab daily for 2 days, start on thurs, take until finished.. 05/21/15   Billy Fischer, MD   Meds Ordered and Administered this Visit   Medications  triamcinolone acetonide (KENALOG-40) injection 40 mg (not administered)    BP 154/84 mmHg  Pulse 77  Temp(Src) 98.4 F (36.9 C) (Oral)  Resp 18  SpO2 100% No data found.   Physical Exam  Constitutional: She is oriented to person, place, and time. She appears well-developed and well-nourished. No distress.  HENT:  Right Ear: External ear normal.  Left Ear: External ear normal.  Mouth/Throat: Oropharynx is clear and moist.  Eyes: Conjunctivae are normal. Pupils are equal, round, and reactive to light.  Neck: Normal range of motion. Neck supple.  Cardiovascular: Normal rate, regular rhythm, normal heart sounds and intact distal pulses.   Pulmonary/Chest: Effort normal and breath sounds normal.  Lymphadenopathy:    She has no cervical adenopathy.  Neurological: She is alert and oriented to person, place, and time.  Skin: Skin is warm and dry.  Nursing note and vitals  reviewed.   ED Course  Procedures (including critical care time)  Labs Review Labs Reviewed - No data to display  Imaging Review No results found.   Visual Acuity Review  Right Eye Distance:   Left Eye Distance:   Bilateral Distance:    Right Eye Near:   Left Eye Near:    Bilateral Near:         MDM   1. Seasonal allergic rhinitis    Meds ordered this encounter  Medications  . triamcinolone  acetonide (KENALOG-40) injection 40 mg    Sig:   . Topiramate (TOPAMAX PO)    Sig: Take by mouth.  Marland Kitchen PHENTERMINE HCL PO    Sig: Take by mouth.  . Pseudoephedrine-APAP-DM (DAYQUIL PO)    Sig: Take by mouth.  Marland Kitchen ibuprofen (ADVIL,MOTRIN) 200 MG tablet    Sig: Take 200 mg by mouth every 6 (six) hours as needed.  Marland Kitchen acetaminophen (TYLENOL) 325 MG tablet    Sig: Take 650 mg by mouth every 6 (six) hours as needed.  . fluticasone (FLONASE) 50 MCG/ACT nasal spray    Sig: Place 1 spray into both nostrils 2 (two) times daily.    Dispense:  1 g    Refill:  2  . fexofenadine (ALLEGRA) 180 MG tablet    Sig: Take 1 tablet (180 mg total) by mouth daily.    Dispense:  30 tablet    Refill:  1  . predniSONE (DELTASONE) 50 MG tablet    Sig: 1 tab daily for 2 days then 1/2 tab daily for 2 days, start on thurs, take until finished..    Dispense:  3 tablet    Refill:  0       Billy Fischer, MD 05/21/15 [id number]  Billy Fischer, MD 05/21/15 [phone]

## 2015-05-24 DIAGNOSIS — L989 Disorder of the skin and subcutaneous tissue, unspecified: Secondary | ICD-10-CM | POA: Insufficient documentation

## 2015-05-24 DIAGNOSIS — L719 Rosacea, unspecified: Secondary | ICD-10-CM | POA: Insufficient documentation

## 2016-04-01 ENCOUNTER — Ambulatory Visit
Admission: RE | Admit: 2016-04-01 | Discharge: 2016-04-01 | Disposition: A | Discharge status: Home / Self Care | Payer: BLUE CROSS/BLUE SHIELD | Source: Ambulatory Visit | Attending: Family Medicine | Admitting: Family Medicine

## 2016-04-01 ENCOUNTER — Other Ambulatory Visit: Payer: Self-pay | Admitting: Family Medicine

## 2016-04-01 DIAGNOSIS — Z1231 Encounter for screening mammogram for malignant neoplasm of breast: Secondary | ICD-10-CM | POA: Insufficient documentation

## 2016-08-11 DIAGNOSIS — M25512 Pain in left shoulder: Secondary | ICD-10-CM | POA: Insufficient documentation

## 2017-03-07 DIAGNOSIS — D2330 Other benign neoplasm of skin of unspecified part of face: Secondary | ICD-10-CM | POA: Insufficient documentation

## 2017-05-13 ENCOUNTER — Ambulatory Visit
Admission: RE | Admit: 2017-05-13 | Discharge: 2017-05-13 | Disposition: A | Discharge status: Home / Self Care | Payer: BLUE CROSS/BLUE SHIELD | Source: Ambulatory Visit | Attending: Family Medicine | Admitting: Family Medicine

## 2017-05-13 ENCOUNTER — Other Ambulatory Visit: Payer: Self-pay | Admitting: Family Medicine

## 2017-05-13 DIAGNOSIS — Z1231 Encounter for screening mammogram for malignant neoplasm of breast: Secondary | ICD-10-CM

## 2017-06-15 ENCOUNTER — Emergency Department (HOSPITAL_COMMUNITY)
Admission: EM | Admit: 2017-06-15 | Discharge: 2017-06-15 | Disposition: A | Discharge status: Home / Self Care | Payer: BLUE CROSS/BLUE SHIELD | Attending: Emergency Medicine | Admitting: Emergency Medicine

## 2017-06-15 ENCOUNTER — Encounter (HOSPITAL_COMMUNITY): Payer: Self-pay

## 2017-06-15 ENCOUNTER — Other Ambulatory Visit: Payer: Self-pay

## 2017-06-15 ENCOUNTER — Emergency Department (HOSPITAL_COMMUNITY): Payer: BLUE CROSS/BLUE SHIELD

## 2017-06-15 DIAGNOSIS — I10 Essential (primary) hypertension: Secondary | ICD-10-CM | POA: Insufficient documentation

## 2017-06-15 DIAGNOSIS — Z79899 Other long term (current) drug therapy: Secondary | ICD-10-CM | POA: Insufficient documentation

## 2017-06-15 DIAGNOSIS — R0602 Shortness of breath: Secondary | ICD-10-CM | POA: Diagnosis present

## 2017-06-15 DIAGNOSIS — R0600 Dyspnea, unspecified: Secondary | ICD-10-CM | POA: Diagnosis not present

## 2017-06-15 LAB — I-STAT TROPONIN, ED
Troponin i, poc: 0 ng/mL (ref 0.00–0.08)
Troponin i, poc: 0.01 ng/mL (ref 0.00–0.08)

## 2017-06-15 LAB — CBC
HCT: 41 % (ref 36.0–46.0)
Hemoglobin: 13.5 g/dL (ref 12.0–15.0)
MCH: 29.3 pg (ref 26.0–34.0)
MCHC: 32.9 g/dL (ref 30.0–36.0)
MCV: 89.1 fL (ref 78.0–100.0)
Platelets: 233 10*3/uL (ref 150–400)
RBC: 4.6 MIL/uL (ref 3.87–5.11)
RDW: 12 % (ref 11.5–15.5)
WBC: 4.6 10*3/uL (ref 4.0–10.5)

## 2017-06-15 LAB — BASIC METABOLIC PANEL
Anion gap: 9 (ref 5–15)
BUN: 12 mg/dL (ref 6–20)
CO2: 24 mmol/L (ref 22–32)
Calcium: 9.3 mg/dL (ref 8.9–10.3)
Chloride: 105 mmol/L (ref 101–111)
Creatinine, Ser: 0.96 mg/dL (ref 0.44–1.00)
GFR calc Af Amer: >60 mL/min (ref >60)
GFR calc non Af Amer: >60 mL/min (ref >60)
Glucose, Bld: 105 mg/dL — ABNORMAL HIGH (ref 65–99)
Potassium: 3.9 mmol/L (ref 3.5–5.1)
Sodium: 138 mmol/L (ref 135–145)

## 2017-06-15 LAB — I-STAT BETA HCG BLOOD, ED (MC, WL, AP ONLY): I-stat hCG, quantitative: 5.6 m[IU]/mL — ABNORMAL HIGH (ref <5)

## 2017-06-15 LAB — D-DIMER, QUANTITATIVE: D-Dimer, Quant: <0.27 ug/mL-FEU (ref 0.00–0.50)

## 2017-06-15 MED ORDER — HYDROCHLOROTHIAZIDE 25 MG PO TABS
25.0000 mg | ORAL_TABLET | Freq: Every day | ORAL | 0 refills | Status: DC
Start: 2017-06-15 — End: 2017-06-22

## 2017-06-15 NOTE — ED Triage Notes (Signed)
Pt states she has been having shortness of breath X1 week. She states she began having some pain between her shoulder blades Sunday that resolved. Pt reports this morning while driving to work she began having tightness in her chest as well as light headedness.

## 2017-06-15 NOTE — ED Provider Notes (Signed)
[facility] EMERGENCY DEPARTMENT Provider Note   CSN: [id number] Arrival date & time: 06/15/17  6237     History   Chief Complaint Chief Complaint  Patient presents with  . Shortness of Breath    HPI Cheryl Mayer is a 52 y.o. female.  The history is provided by the patient. No language interpreter was used.  Shortness of Breath  This is a new problem.    History reviewed. No pertinent past medical history.  There are no active problems to display for this patient.   Past Surgical History:  Procedure Laterality Date  . ABDOMINAL HYSTERECTOMY       OB History   None      Home Medications    Prior to Admission medications   Medication Sig Start Date End Date Taking? Authorizing Provider  acetaminophen (TYLENOL) 325 MG tablet Take 650 mg by mouth every 6 (six) hours as needed for mild pain.    Yes [provider]  aspirin-acetaminophen-caffeine (EXCEDRIN MIGRAINE) [phone] MG tablet Take 1 tablet by mouth every 6 (six) hours as needed for headache.   Yes [provider]  naproxen sodium (ALEVE) 220 MG tablet Take 220 mg by mouth 2 (two) times daily as needed (pain).   Yes [provider]  phentermine (ADIPEX-P) 37.5 MG tablet Take 37.5 mg by mouth every morning. 06/13/17  Yes [provider]  topiramate (TOPAMAX) 25 MG tablet Take 25 mg by mouth daily. 06/12/17  Yes [provider]  hydrochlorothiazide (HYDRODIURIL) 25 MG tablet Take 1 tablet (25 mg total) by mouth daily. 06/15/17   Fransico Meadow, PA-C    Family History Family History  Problem Relation Age of Onset  . Breast cancer Paternal Grandmother 26    Social History Social History   Tobacco Use  . Smoking status: Never Smoker  . Smokeless tobacco: Never Used  Substance Use Topics  . Alcohol use: No  . Drug use: No     Allergies   Patient has no known allergies.   Review of Systems Review of Systems  Respiratory:  Positive for shortness of breath.   All other systems reviewed and are negative.    Physical Exam Updated Vital Signs BP (!) 166/97   Pulse 85   Temp 97.9 F (36.6 C) (Oral)   Resp (!) 29   SpO2 99%   Physical Exam  Constitutional: She appears well-developed and well-nourished. No distress.  HENT:  Head: Normocephalic and atraumatic.  Mouth/Throat: Oropharynx is clear and moist.  Eyes: Conjunctivae are normal.  Neck: Normal range of motion. Neck supple.  Cardiovascular: Normal rate and regular rhythm.  No murmur heard. Pulmonary/Chest: Effort normal and breath sounds normal. No respiratory distress. She exhibits no tenderness.  Abdominal: Soft. There is no tenderness.  Musculoskeletal: She exhibits no edema.       Right lower leg: Normal.       Left lower leg: Normal.  Neurological: She is alert.  Skin: Skin is warm and dry.  Psychiatric: She has a normal mood and affect.  Nursing note and vitals reviewed.    ED Treatments / Results  Labs (all labs ordered are listed, but only abnormal results are displayed) Labs Reviewed  BASIC METABOLIC PANEL - Abnormal; Notable for the following components:      Result Value   Glucose, Bld 105 (*)    All other components within normal limits  I-STAT BETA HCG BLOOD, ED ([facility], [facility], [facility] ONLY) - Abnormal; Notable  for the following components:   I-stat hCG, quantitative 5.6 (*)    All other components within normal limits  CBC  D-DIMER, QUANTITATIVE (NOT AT [facility])  I-STAT TROPONIN, ED  I-STAT TROPONIN, ED    EKG EKG Interpretation  Date/Time:  Wednesday Jun 15 2017 07:51:31 EDT Ventricular Rate:  80 PR Interval:    QRS Duration: 92 QT Interval:  376 QTC Calculation: 434 R Axis:   15 Text Interpretation:  Sinus rhythm Confirmed by Davonna Belling [phone]) on 06/15/2017 8:13:57 AM   Radiology Dg Chest 2 View  Result Date: 06/15/2017 CLINICAL DATA:  Shortness of breath over the last week, some shoulder pain EXAM: CHEST - 2  VIEW COMPARISON:  None. FINDINGS: No active infiltrate or effusion is seen. Mediastinal and hilar contours are unremarkable. The heart is within normal limits in size. No bony abnormality is seen. IMPRESSION: No active cardiopulmonary disease. Electronically Signed   By: Ivar Drape M.D.   On: 06/15/2017 07:59    Procedures Procedures (including critical care time)  Medications Ordered in ED Medications - No data to display   Initial Impression / Assessment and Plan / ED Course  I have reviewed the triage vital signs and the nursing notes.  Pertinent labs & imaging results that were available during my care of the patient were reviewed by me and considered in my medical decision making (see chart for details).     MDM  Pt's 02 sats 99% on room air.  EKg no acute abnormality.  Pt has a negative troponin x 2.  ddimer is negative,   Chest xray is normal.   Pt's blood pressure decreased but is still elevated.  I will start pt on hctz.   Pt advised to see Dr. Redmond Pulling for recheck in 2-3 days.    Final Clinical Impressions(s) / ED Diagnoses   Final diagnoses:  Dyspnea, unspecified type  Hypertension, unspecified type    ED Discharge Orders        Ordered    hydrochlorothiazide (HYDRODIURIL) 25 MG tablet  Daily     06/15/17 1343    An After Visit Summary was printed and given to the patient.    Fransico Meadow, Hershal Coria 06/15/17 1431    Davonna Belling, MD 06/15/17 [phone]

## 2017-06-15 NOTE — Discharge Instructions (Signed)
See Dr. Redmond Pulling for recheck. Return if any problems.

## 2017-06-21 ENCOUNTER — Encounter (HOSPITAL_COMMUNITY): Payer: Self-pay

## 2017-06-21 ENCOUNTER — Emergency Department (HOSPITAL_COMMUNITY): Payer: BLUE CROSS/BLUE SHIELD

## 2017-06-21 ENCOUNTER — Other Ambulatory Visit: Payer: Self-pay

## 2017-06-21 ENCOUNTER — Observation Stay (HOSPITAL_COMMUNITY)
Admission: EM | Admit: 2017-06-21 | Discharge: 2017-06-22 | Disposition: A | Discharge status: Home / Self Care | Payer: BLUE CROSS/BLUE SHIELD | Attending: Internal Medicine | Admitting: Internal Medicine

## 2017-06-21 DIAGNOSIS — Z79899 Other long term (current) drug therapy: Secondary | ICD-10-CM | POA: Diagnosis not present

## 2017-06-21 DIAGNOSIS — R0609 Other forms of dyspnea: Secondary | ICD-10-CM | POA: Diagnosis not present

## 2017-06-21 DIAGNOSIS — Z823 Family history of stroke: Secondary | ICD-10-CM | POA: Diagnosis not present

## 2017-06-21 DIAGNOSIS — R0600 Dyspnea, unspecified: Secondary | ICD-10-CM | POA: Diagnosis not present

## 2017-06-21 DIAGNOSIS — Z7982 Long term (current) use of aspirin: Secondary | ICD-10-CM | POA: Diagnosis not present

## 2017-06-21 DIAGNOSIS — Z90710 Acquired absence of both cervix and uterus: Secondary | ICD-10-CM | POA: Diagnosis not present

## 2017-06-21 DIAGNOSIS — I351 Nonrheumatic aortic (valve) insufficiency: Secondary | ICD-10-CM | POA: Insufficient documentation

## 2017-06-21 DIAGNOSIS — R911 Solitary pulmonary nodule: Secondary | ICD-10-CM | POA: Diagnosis not present

## 2017-06-21 DIAGNOSIS — D72829 Elevated white blood cell count, unspecified: Secondary | ICD-10-CM | POA: Diagnosis not present

## 2017-06-21 DIAGNOSIS — R55 Syncope and collapse: Principal | ICD-10-CM | POA: Diagnosis present

## 2017-06-21 DIAGNOSIS — E860 Dehydration: Secondary | ICD-10-CM | POA: Insufficient documentation

## 2017-06-21 DIAGNOSIS — I10 Essential (primary) hypertension: Secondary | ICD-10-CM | POA: Diagnosis not present

## 2017-06-21 DIAGNOSIS — N179 Acute kidney failure, unspecified: Secondary | ICD-10-CM | POA: Diagnosis present

## 2017-06-21 DIAGNOSIS — G43909 Migraine, unspecified, not intractable, without status migrainosus: Secondary | ICD-10-CM | POA: Diagnosis not present

## 2017-06-21 DIAGNOSIS — Z8249 Family history of ischemic heart disease and other diseases of the circulatory system: Secondary | ICD-10-CM | POA: Insufficient documentation

## 2017-06-21 DIAGNOSIS — R0789 Other chest pain: Secondary | ICD-10-CM | POA: Diagnosis not present

## 2017-06-21 DIAGNOSIS — E669 Obesity, unspecified: Secondary | ICD-10-CM | POA: Insufficient documentation

## 2017-06-21 DIAGNOSIS — M5144 Schmorl's nodes, thoracic region: Secondary | ICD-10-CM | POA: Insufficient documentation

## 2017-06-21 DIAGNOSIS — Z6830 Body mass index (BMI) 30.0-30.9, adult: Secondary | ICD-10-CM | POA: Diagnosis not present

## 2017-06-21 DIAGNOSIS — Z803 Family history of malignant neoplasm of breast: Secondary | ICD-10-CM | POA: Insufficient documentation

## 2017-06-21 HISTORY — DX: Essential (primary) hypertension: I10

## 2017-06-21 HISTORY — DX: Migraine, unspecified, not intractable, without status migrainosus: G43.909

## 2017-06-21 HISTORY — DX: Obesity, unspecified: E66.9

## 2017-06-21 HISTORY — DX: Syncope and collapse: R55

## 2017-06-21 LAB — CBC
HCT: 40.5 % (ref 36.0–46.0)
Hemoglobin: 13.5 g/dL (ref 12.0–15.0)
MCH: 29.7 pg (ref 26.0–34.0)
MCHC: 33.3 g/dL (ref 30.0–36.0)
MCV: 89 fL (ref 78.0–100.0)
Platelets: 260 10*3/uL (ref 150–400)
RBC: 4.55 MIL/uL (ref 3.87–5.11)
RDW: 12.1 % (ref 11.5–15.5)
WBC: 10.8 10*3/uL — ABNORMAL HIGH (ref 4.0–10.5)

## 2017-06-21 LAB — BASIC METABOLIC PANEL
Anion gap: 12 (ref 5–15)
BUN: 36 mg/dL — ABNORMAL HIGH (ref 6–20)
CO2: 24 mmol/L (ref 22–32)
Calcium: 9.1 mg/dL (ref 8.9–10.3)
Chloride: 102 mmol/L (ref 101–111)
Creatinine, Ser: 1.47 mg/dL — ABNORMAL HIGH (ref 0.44–1.00)
GFR calc Af Amer: 46 mL/min — ABNORMAL LOW (ref >60)
GFR calc non Af Amer: 40 mL/min — ABNORMAL LOW (ref >60)
Glucose, Bld: 132 mg/dL — ABNORMAL HIGH (ref 65–99)
Potassium: 3.3 mmol/L — ABNORMAL LOW (ref 3.5–5.1)
Sodium: 138 mmol/L (ref 135–145)

## 2017-06-21 LAB — URINALYSIS, ROUTINE W REFLEX MICROSCOPIC
Bilirubin Urine: NEGATIVE
Glucose, UA: NEGATIVE mg/dL
Hgb urine dipstick: NEGATIVE
Ketones, ur: NEGATIVE mg/dL
Nitrite: NEGATIVE
Protein, ur: 30 mg/dL — AB
Specific Gravity, Urine: 1.012 (ref 1.005–1.030)
pH: 5 (ref 5.0–8.0)

## 2017-06-21 LAB — TROPONIN I: Troponin I: <0.03 ng/mL (ref <0.03)

## 2017-06-21 LAB — CBG MONITORING, ED: Glucose-Capillary: 120 mg/dL — ABNORMAL HIGH (ref 65–99)

## 2017-06-21 LAB — D-DIMER, QUANTITATIVE: D-Dimer, Quant: <0.27 ug/mL-FEU (ref 0.00–0.50)

## 2017-06-21 MED ORDER — ENOXAPARIN SODIUM 40 MG/0.4ML ~~LOC~~ SOLN
40.0000 mg | Freq: Every day | SUBCUTANEOUS | Status: DC
  Filled 2017-06-21 (×2): qty 0.4

## 2017-06-21 MED ORDER — HYDRALAZINE HCL 20 MG/ML IJ SOLN: 10.0000 mg | INTRAMUSCULAR | Status: DC | PRN

## 2017-06-21 MED ORDER — SODIUM CHLORIDE 0.9 % IV SOLN
INTRAVENOUS | Status: DC
  Administered 2017-06-21: via INTRAVENOUS

## 2017-06-21 MED ORDER — ACETAMINOPHEN 325 MG PO TABS
650.0000 mg | ORAL_TABLET | Freq: Once | ORAL | Status: AC
  Administered 2017-06-21: 650 mg via ORAL
  Filled 2017-06-21: qty 2

## 2017-06-21 MED ORDER — ACETAMINOPHEN 325 MG PO TABS: 650.0000 mg | ORAL_TABLET | Freq: Four times a day (QID) | ORAL | Status: DC | PRN

## 2017-06-21 MED ORDER — POTASSIUM CHLORIDE CRYS ER 20 MEQ PO TBCR
20.0000 meq | EXTENDED_RELEASE_TABLET | Freq: Once | ORAL | Status: AC
  Administered 2017-06-21: 20 meq via ORAL
  Filled 2017-06-21: qty 1

## 2017-06-21 MED ORDER — ACETAMINOPHEN 650 MG RE SUPP: 650.0000 mg | Freq: Four times a day (QID) | RECTAL | Status: DC | PRN

## 2017-06-21 MED ORDER — ONDANSETRON HCL 4 MG PO TABS: 4.0000 mg | ORAL_TABLET | Freq: Four times a day (QID) | ORAL | Status: DC | PRN

## 2017-06-21 MED ORDER — SODIUM CHLORIDE 0.9 % IV BOLUS
1000.0000 mL | Freq: Once | INTRAVENOUS | Status: AC
  Administered 2017-06-21: 1000 mL via INTRAVENOUS

## 2017-06-21 MED ORDER — TOPIRAMATE 25 MG PO TABS
25.0000 mg | ORAL_TABLET | Freq: Every day | ORAL | Status: DC
  Administered 2017-06-22: 25 mg via ORAL
  Filled 2017-06-21: qty 1

## 2017-06-21 MED ORDER — ONDANSETRON HCL 4 MG/2ML IJ SOLN: 4.0000 mg | Freq: Four times a day (QID) | INTRAMUSCULAR | Status: DC | PRN

## 2017-06-21 NOTE — ED Provider Notes (Signed)
  Face-to-face evaluation   History: She is here for evaluation of syncope, while sitting, with a prodrome of dizziness.  This occurred after taking first dose of lisinopril this morning.  She was recently started on hydrochlorothiazide after an evaluation of her chest and mid back pain, between scapula.  She saw a PCP yesterday and was started on lisinopril, first dose was given today.  Physical exam: Alert, calm, cooperative.  Heart regular rate and rhythm without murmur lungs clear anteriorly, and posteriorly.  She is lucid.  Normal range of motion arms and legs bilaterally.  Medical screening examination/treatment/procedure(s) were conducted as a shared visit with non-physician practitioner(s) and myself.  I personally evaluated the patient during the encounter    Daleen Bo, MD 06/21/17 2334

## 2017-06-21 NOTE — ED Triage Notes (Signed)
Per EMS: Pt is [place] from [place] and it is reported that she had a witnessed syncopal event. Pt has had recent changes to BP medications. Pt was put on HTZ and lisinporil. Pt started those medications this morning.  Pt had lunch around 1 and then went into a store and about 45 minutes later pt passed out.

## 2017-06-21 NOTE — ED Provider Notes (Signed)
[facility] DEPT Provider Note   CSN: [id number] Arrival date & time: 06/21/17  1450     History   Chief Complaint Chief Complaint  Patient presents with  . Loss of Consciousness    HPI Cheryl Mayer is a 52 y.o. female.  I saw pt a week ago and started her on HCtz.  Pt saw  her primary yesterday and was started on lisinopril/HCTZ pt took her first dose today at about 8:00.  She reports that she was in the car with her daughter and she began feeling lightheaded nauseated and experiencing abdominal pain.  She has to her daughter to take her to [place] to her other daughter and then she passed out.  EMS was called and reported that patient's blood pressure was low and they arrived.  Patient concerned that blood pressure medicines made her blood pressure dropped too fast.  Patient reports she is continued to experience shortness of breath since being seen  The history is provided by the patient. No language interpreter was used.  Loss of Consciousness   This is a new problem. The current episode started less than 1 hour ago. The problem occurs constantly. The problem has been gradually improving. There was no loss of consciousness. The problem is associated with normal activity. Associated symptoms include diaphoresis and light-headedness. She has tried nothing for the symptoms. The treatment provided no relief.    History reviewed. No pertinent past medical history.  There are no active problems to display for this patient.   Past Surgical History:  Procedure Laterality Date  . ABDOMINAL HYSTERECTOMY       OB History   None      Home Medications    Prior to Admission medications   Medication Sig Start Date End Date Taking? Authorizing Provider  acetaminophen (TYLENOL) 325 MG tablet Take 650 mg by mouth every 6 (six) hours as needed for mild pain.    Yes [provider]  aspirin-acetaminophen-caffeine (EXCEDRIN MIGRAINE)  [id number] MG tablet Take 1 tablet by mouth every 6 (six) hours as needed for headache.   Yes [provider]  hydrochlorothiazide (HYDRODIURIL) 25 MG tablet Take 1 tablet (25 mg total) by mouth daily. 06/15/17  Yes Fransico Meadow, PA-C  lisinopril-hydrochlorothiazide (PRINZIDE,ZESTORETIC) 20-25 MG tablet Take 1 tablet by mouth daily. 06/20/17  Yes [provider]  naproxen sodium (ALEVE) 220 MG tablet Take 220 mg by mouth 2 (two) times daily as needed (pain).   Yes [provider]  phentermine (ADIPEX-P) 37.5 MG tablet Take 37.5 mg by mouth every morning. 06/13/17  Yes [provider]  topiramate (TOPAMAX) 25 MG tablet Take 25 mg by mouth daily. 06/12/17  Yes [provider]    Family History Family History  Problem Relation Age of Onset  . Breast cancer Paternal Grandmother 74    Social History Social History   Tobacco Use  . Smoking status: Never Smoker  . Smokeless tobacco: Never Used  Substance Use Topics  . Alcohol use: No  . Drug use: No     Allergies   Patient has no known allergies.   Review of Systems Review of Systems  Constitutional: Positive for diaphoresis.  Respiratory: Positive for shortness of breath.   Cardiovascular: Positive for syncope.  Neurological: Positive for syncope and light-headedness.  All other systems reviewed and are negative.    Physical Exam Updated Vital Signs BP (!) 117/59 (BP Location: Right Arm)   Pulse 81  Temp (!) 97.5 F (36.4 C) (Oral)   Resp 14   Ht 5\' 4"  (1.626 m)   Wt 79.4 kg (175 lb)   SpO2 98%   BMI 30.04 kg/m   Physical Exam  Constitutional: She is oriented to person, place, and time. She appears well-developed and well-nourished.  HENT:  Head: Normocephalic.  Right Ear: External ear normal.  Left Ear: External ear normal.  Eyes: Pupils are equal, round, and reactive to light. Conjunctivae and EOM are normal.  Neck: Normal range of motion.  Cardiovascular: Normal  rate and regular rhythm.  Pulmonary/Chest: Effort normal and breath sounds normal.  Abdominal: Soft. She exhibits no distension.  Musculoskeletal: Normal range of motion.  Neurological: She is alert and oriented to person, place, and time.  Skin: Skin is warm.  Psychiatric: She has a normal mood and affect.  Nursing note and vitals reviewed.    ED Treatments / Results  Labs (all labs ordered are listed, but only abnormal results are displayed) Labs Reviewed  BASIC METABOLIC PANEL - Abnormal; Notable for the following components:      Result Value   Potassium 3.3 (*)    Glucose, Bld 132 (*)    BUN 36 (*)    Creatinine, Ser 1.47 (*)    GFR calc non Af Amer 40 (*)    GFR calc Af Amer 46 (*)    All other components within normal limits  CBC - Abnormal; Notable for the following components:   WBC 10.8 (*)    All other components within normal limits  URINALYSIS, ROUTINE W REFLEX MICROSCOPIC - Abnormal; Notable for the following components:   APPearance CLOUDY (*)    Protein, ur 30 (*)    Leukocytes, UA TRACE (*)    Bacteria, UA FEW (*)    All other components within normal limits  CBG MONITORING, ED - Abnormal; Notable for the following components:   Glucose-Capillary 120 (*)    All other components within normal limits  TROPONIN I  D-DIMER, QUANTITATIVE (NOT AT [facility])    EKG EKG Interpretation  Date/Time:  Tuesday Jun 21 2017 15:11:34 EDT Ventricular Rate:  81 PR Interval:    QRS Duration: 81 QT Interval:  416 QTC Calculation: 483 R Axis:   15 Text Interpretation:  Sinus rhythm Atrial premature complex Nonspecific T abnormalities, lateral leads , new since last tracing Confirmed by Dorie Rank [id number]) on 06/21/2017 3:15:28 PM   Radiology Ct Head Wo Contrast  Result Date: 06/21/2017 CLINICAL DATA:  52 year old female with a history of hypertension and headache EXAM: CT HEAD WITHOUT CONTRAST TECHNIQUE: Contiguous axial images were obtained from the base of the skull  through the vertex without intravenous contrast. COMPARISON:  None. FINDINGS: Brain: No acute intracranial hemorrhage. No midline shift or mass effect. Gray-white differentiation maintained. Unremarkable appearance of the ventricular system. Vascular: Unremarkable. Skull: No acute fracture.  No aggressive bone lesion identified. Sinuses/Orbits: Unremarkable appearance of the orbits. Mastoid air cells clear. No middle ear effusion. No significant sinus disease. Other: None IMPRESSION: Negative head CT Electronically Signed   By: Corrie Mckusick D.O.   On: 06/21/2017 16:34    Procedures Procedures (including critical care time)  Medications Ordered in ED Medications - No data to display   Initial Impression / Assessment and Plan / ED Course  I have reviewed the triage vital signs and the nursing notes.  Pertinent labs & imaging results that were available during my care of the patient were reviewed by me and  considered in my medical decision making (see chart for details).  Clinical Course as of Jun 22 2138  Tue Jun 21, 2017  2136 Troponin I [LS]  9562 Basic metabolic panel(!) [LS]    Clinical Course User Index [LS] Fransico Meadow, PA-C    MDM  Pt has increased BUN and creatine since previous visit,  Decreased potassium.  I suspect dehydraion.  Iv fluids ordered.  Po potassium.  [facility] Dr. Tamala Julian to see pt for admission  Final Clinical Impressions(s) / ED Diagnoses   Final diagnoses:  Syncope, unspecified syncope type  Acute kidney injury [facility])  Dehydration    ED Discharge Orders    None       Sidney Ace 06/21/17 2242    Daleen Bo, MD 06/21/17 [phone]

## 2017-06-21 NOTE — H&P (Signed)
History and Physical    Cheryl Mayer WNI:[id number] DOB: January 21, 1966 DOA: 06/21/2017  Referring MD/NP/PA: Alyse Low PA-C PCP: Christain Sacramento, MD  Patient coming from: Via EMS  Chief Complaint: Syncope  I have personally briefly reviewed patient's old medical records in [place]   HPI: Cheryl Mayer is a 52 y.o. female with medical history significant of HTN and obesity; who presents after having a syncopal episode.  Patient had been out with her daughter today when she reported feeling lightheaded and short of breath.  Patient was able to get to the car and was sitting at the time when she reportedly lost consciousness.  Associated symptoms included malaise, generalized abdominal pain, nausea, headache, palpitations, and diaphoresis.  Over the last 2 weeks patient reports having issues with shortness of breath with exertion, chest tightness, easy bruisability, and pain in between her scapula.  She previously notes a history of hypertension, but has not required medication for treatment.  She was seen in the emergency department 5 days ago for her symptoms at that time was started on hydrochlorothiazide after blood pressures were noted to be elevated.  Since that time patient has been taking her blood pressure medication as advised.despite doing these interventions patient reports her systolic blood pressures at home have been elevated into the 180s with a reported 20 point difference between arms.  She is seen by her PCP yesterday, and changed to a lisinopril-hydrochlorothiazide combo medication due to persistently elevated  blood pressures. Lab work was also obtained at that time patient was noted to have normal thyroid levels.  Patient reports stopping phentermine which she was taking for focus approximately 1 week ago due to her symptoms.  Denies any use of alcohol, tobacco, recent travel, calf pain, leg swelling, hormone replacement, or change in weight.   ED Course: Upon  admission to the emergency department patient was noted to be afebrile with vital signs relatively within normal limits.  Labs revealed WBC 10.8, potassium 3.3, BUN 36, creatinine 1.47, troponin negative, d-dimer<0.27.  CT scan of the brain was negative for any acute abnormalities.  Patient was given 20 mEq of potassium chloride and 1 L of normal saline IV fluids.  Review of Systems  Constitutional: Positive for malaise/fatigue. Negative for chills and fever.  HENT: Negative for nosebleeds and sinus pain.   Eyes: Negative for double vision and photophobia.  Respiratory: Positive for shortness of breath. Negative for cough and sputum production.   Cardiovascular: Positive for palpitations. Negative for leg swelling.       For chest tightness  Gastrointestinal: Positive for abdominal pain and nausea. Negative for constipation, diarrhea and vomiting.  Genitourinary: Negative for dysuria and hematuria.  Musculoskeletal: Positive for back pain. Negative for falls.       For pain between scapulas  Skin: Negative for rash.  Neurological: Positive for loss of consciousness and headaches. Negative for weakness.  Endo/Heme/Allergies: Negative for polydipsia. Bruises/bleeds easily.  Psychiatric/Behavioral: Negative for substance abuse and suicidal ideas. The patient is not nervous/anxious.     History reviewed. No pertinent past medical history.  Past Surgical History:  Procedure Laterality Date  . ABDOMINAL HYSTERECTOMY       reports that she has never smoked. She has never used smokeless tobacco. She reports that she does not drink alcohol or use drugs.  No Known Allergies  Family History  Problem Relation Age of Onset  . Breast cancer Paternal Grandmother 90    Prior to Admission medications  Medication Sig Start Date End Date Taking? Authorizing Provider  acetaminophen (TYLENOL) 325 MG tablet Take 650 mg by mouth every 6 (six) hours as needed for mild pain.    Yes [provider]  aspirin-acetaminophen-caffeine (EXCEDRIN MIGRAINE) [phone] MG tablet Take 1 tablet by mouth every 6 (six) hours as needed for headache.   Yes [provider]  hydrochlorothiazide (HYDRODIURIL) 25 MG tablet Take 1 tablet (25 mg total) by mouth daily. 06/15/17  Yes Fransico Meadow, PA-C  lisinopril-hydrochlorothiazide (PRINZIDE,ZESTORETIC) 20-25 MG tablet Take 1 tablet by mouth daily. 06/20/17  Yes [provider]  naproxen sodium (ALEVE) 220 MG tablet Take 220 mg by mouth 2 (two) times daily as needed (pain).   Yes [provider]  phentermine (ADIPEX-P) 37.5 MG tablet Take 37.5 mg by mouth every morning. 06/13/17  Yes [provider]  topiramate (TOPAMAX) 25 MG tablet Take 25 mg by mouth daily. 06/12/17  Yes [provider]    Physical Exam:  Constitutional: NAD, calm, comfortable Vitals:   06/21/17 1507 06/21/17 1757 06/21/17 2001  BP: (!) 117/59 127/68 121/61  Pulse: 81 80 73  Resp: 14 13 14   Temp: (!) 97.5 F (36.4 C)  (!) 97.4 F (36.3 C)  TempSrc: Oral  Oral  SpO2: 98% 98% 97%  Weight: 79.4 kg (175 lb)    Height: 5\' 4"  (1.626 m)     Eyes: PERRL, lids and conjunctivae normal ENMT: Mucous membranes are moist. Posterior pharynx clear of any exudate or lesions.Normal dentition.  Neck: normal, supple, no masses, no thyromegaly Respiratory: clear to auscultation bilaterally, no wheezing, no crackles. Normal respiratory effort. No accessory muscle use.  Cardiovascular: Regular rate and rhythm, no murmurs / rubs / gallops. No extremity edema. 2+ pedal pulses. No carotid bruits.  Abdomen: no tenderness, no masses palpated. No hepatosplenomegaly. Bowel sounds positive.  Musculoskeletal: no clubbing / cyanosis. No joint deformity upper and lower extremities. Good ROM, no contractures. Normal muscle tone.  Skin: Bruising present of the anterior thigh of the left leg. Neurologic: CN 2-12 grossly intact. Sensation intact, DTR normal. Strength  5/5 in all 4.  Psychiatric: Normal judgment and insight. Alert and oriented x 3. Normal mood.     Labs on Admission: I have personally reviewed following labs and imaging studies  CBC: Recent Labs  Lab 06/15/17 0759 06/21/17 1617  WBC 4.6 10.8*  HGB 13.5 13.5  HCT 41.0 40.5  MCV 89.1 89.0  PLT 233 629   Basic Metabolic Panel: Recent Labs  Lab 06/15/17 0759 06/21/17 1617  NA 138 138  K 3.9 3.3*  CL 105 102  CO2 24 24  GLUCOSE 105* 132*  BUN 12 36*  CREATININE 0.96 1.47*  CALCIUM 9.3 9.1   GFR: Estimated Creatinine Clearance: 45.7 mL/min (A) (by C-G formula based on SCr of 1.47 mg/dL (H)). Liver Function Tests: No results for input(s): AST, ALT, ALKPHOS, BILITOT, PROT, ALBUMIN in the last 168 hours. No results for input(s): LIPASE, AMYLASE in the last 168 hours. No results for input(s): AMMONIA in the last 168 hours. Coagulation Profile: No results for input(s): INR, PROTIME in the last 168 hours. Cardiac Enzymes: Recent Labs  Lab 06/21/17 2013  TROPONINI <0.03   BNP (last 3 results) No results for input(s): PROBNP in the last 8760 hours. HbA1C: No results for input(s): HGBA1C in the last 72 hours. CBG: Recent Labs  Lab 06/21/17 1535  GLUCAP 120*   Lipid Profile: No results for input(s): CHOL, HDL, LDLCALC, TRIG,  CHOLHDL, LDLDIRECT in the last 72 hours. Thyroid Function Tests: No results for input(s): TSH, T4TOTAL, FREET4, T3FREE, THYROIDAB in the last 72 hours. Anemia Panel: No results for input(s): VITAMINB12, FOLATE, FERRITIN, TIBC, IRON, RETICCTPCT in the last 72 hours. Urine analysis:    Component Value Date/Time   COLORURINE YELLOW 06/21/2017 1540   APPEARANCEUR CLOUDY (A) 06/21/2017 1540   LABSPEC 1.012 06/21/2017 1540   PHURINE 5.0 06/21/2017 1540   GLUCOSEU NEGATIVE 06/21/2017 1540   HGBUR NEGATIVE 06/21/2017 1540   BILIRUBINUR NEGATIVE 06/21/2017 1540   KETONESUR NEGATIVE 06/21/2017 1540   PROTEINUR 30 (A) 06/21/2017 1540    UROBILINOGEN 0.2 05/07/2007 1020   NITRITE NEGATIVE 06/21/2017 1540   LEUKOCYTESUR TRACE (A) 06/21/2017 1540   Sepsis Labs: No results found for this or any previous visit (from the past 240 hour(s)).   Radiological Exams on Admission: Ct Head Wo Contrast  Result Date: 06/21/2017 CLINICAL DATA:  52 year old female with a history of hypertension and headache EXAM: CT HEAD WITHOUT CONTRAST TECHNIQUE: Contiguous axial images were obtained from the base of the skull through the vertex without intravenous contrast. COMPARISON:  None. FINDINGS: Brain: No acute intracranial hemorrhage. No midline shift or mass effect. Gray-white differentiation maintained. Unremarkable appearance of the ventricular system. Vascular: Unremarkable. Skull: No acute fracture.  No aggressive bone lesion identified. Sinuses/Orbits: Unremarkable appearance of the orbits. Mastoid air cells clear. No middle ear effusion. No significant sinus disease. Other: None IMPRESSION: Negative head CT Electronically Signed   By: Corrie Mckusick D.O.   On: 06/21/2017 16:34    EKG: Independently reviewed.  Sinus rhythm 81 bpm nonspecific T wave changes noted.  Assessment/Plan Syncope: Acute.  Presents after having syncopal episode.  Symptoms and labs give concern for vasovagal syncope.  D-dimer was negative so less concern for pulmonary embolus.  Cannot  - Admit to a telemetry bed - Check orthostatic vital signs - Trend cardiac troponins - check echocardiogram - Follow-up telemetry overnight - Message sent for cardiology to evaluate in a.m    Scapular discomfort: Acute.  Patient reports having intermittent scalp discomfort along with shortness of breath symptoms.  Question possibility of pancreatitis vs. Dissection/coarctation with reports of blood pressure difference between arms. - Add-on LFTs and lipase - Check CT angiogram of the chest, abdomen, pelvis rule out possibility of dissection pulled medications like phentermine which could  put patient risk for arrhythmias.  Acute kidney injury 2/2 dehyradation: Baseline kidney function was noted to be within normal limits, but presents with creatinine 1.47 and BUN 36. - Bolus 1 L normal saline IV fluids and continue at a rate of 161ml/hr  - Recheck BMP in a.m. - Hold nephrotoxic agents  Essential hypertension: Blood pressures noted to be in the 109/64-127/68 on admission while patient lying down. - Hold lisinopril-hydrochlorothiazide due to acute kidney injury  - May consider changing patient to a different medication other than hydrochlorothiazide such as amlodipine  - Hydralazine IV as needed elevated blood pressures overnight   Dyspnea: No clear cause for patient's symptoms noted.  Chest x-ray previously noted to be clear.   - Follow-up CT imaging - DuoNeb prn shortness of breath/wheezing  Leukocytosis: Acute.  WBC elevated 10.8.  Symptoms could be reactive in nature.  No clear source of infection noted. - Recheck CBC in a.m.   DVT prophylaxis: lovenox Code Status: Full Family Communication: Discussed plan of care with patient and family present at bedside Disposition Plan: To be determined Consults called: None Admission status: Observation  Nolawi Kanady  Charmayne Sheer MD [facility] Pager [phone]   If 7PM-7AM, please contact night-coverage [email or web address] Password [facility]  06/21/2017, 10:01 PM

## 2017-06-21 NOTE — ED Notes (Signed)
Bed: WA03 Expected date:  Expected time:  Means of arrival:  Comments: EMS-syncope

## 2017-06-22 ENCOUNTER — Other Ambulatory Visit: Payer: Self-pay | Admitting: Cardiology

## 2017-06-22 ENCOUNTER — Other Ambulatory Visit: Payer: Self-pay

## 2017-06-22 ENCOUNTER — Observation Stay (HOSPITAL_BASED_OUTPATIENT_CLINIC_OR_DEPARTMENT_OTHER): Payer: BLUE CROSS/BLUE SHIELD

## 2017-06-22 ENCOUNTER — Telehealth: Payer: Self-pay

## 2017-06-22 ENCOUNTER — Encounter (HOSPITAL_COMMUNITY): Payer: Self-pay

## 2017-06-22 ENCOUNTER — Observation Stay (HOSPITAL_COMMUNITY): Payer: BLUE CROSS/BLUE SHIELD

## 2017-06-22 DIAGNOSIS — I10 Essential (primary) hypertension: Secondary | ICD-10-CM | POA: Diagnosis present

## 2017-06-22 DIAGNOSIS — R55 Syncope and collapse: Secondary | ICD-10-CM

## 2017-06-22 DIAGNOSIS — I351 Nonrheumatic aortic (valve) insufficiency: Secondary | ICD-10-CM | POA: Diagnosis not present

## 2017-06-22 DIAGNOSIS — D72829 Elevated white blood cell count, unspecified: Secondary | ICD-10-CM | POA: Diagnosis present

## 2017-06-22 DIAGNOSIS — R0600 Dyspnea, unspecified: Secondary | ICD-10-CM | POA: Diagnosis present

## 2017-06-22 DIAGNOSIS — N179 Acute kidney failure, unspecified: Secondary | ICD-10-CM | POA: Diagnosis present

## 2017-06-22 DIAGNOSIS — R0989 Other specified symptoms and signs involving the circulatory and respiratory systems: Secondary | ICD-10-CM

## 2017-06-22 DIAGNOSIS — R079 Chest pain, unspecified: Secondary | ICD-10-CM

## 2017-06-22 LAB — BASIC METABOLIC PANEL
Anion gap: 9 (ref 5–15)
BUN: 26 mg/dL — ABNORMAL HIGH (ref 6–20)
CO2: 23 mmol/L (ref 22–32)
Calcium: 8.8 mg/dL — ABNORMAL LOW (ref 8.9–10.3)
Chloride: 104 mmol/L (ref 101–111)
Creatinine, Ser: 0.81 mg/dL (ref 0.44–1.00)
GFR calc Af Amer: >60 mL/min (ref >60)
GFR calc non Af Amer: >60 mL/min (ref >60)
Glucose, Bld: 128 mg/dL — ABNORMAL HIGH (ref 65–99)
Potassium: 3.6 mmol/L (ref 3.5–5.1)
Sodium: 136 mmol/L (ref 135–145)

## 2017-06-22 LAB — HEPATIC FUNCTION PANEL
ALT: 21 U/L (ref 14–54)
AST: 23 U/L (ref 15–41)
Albumin: 3.7 g/dL (ref 3.5–5.0)
Alkaline Phosphatase: 61 U/L (ref 38–126)
Bilirubin, Direct: 0.1 mg/dL (ref 0.1–0.5)
Indirect Bilirubin: 0.9 mg/dL (ref 0.3–0.9)
Total Bilirubin: 1 mg/dL (ref 0.3–1.2)
Total Protein: 6.8 g/dL (ref 6.5–8.1)

## 2017-06-22 LAB — CBC WITH DIFFERENTIAL/PLATELET
Basophils Absolute: 0 10*3/uL (ref 0.0–0.1)
Basophils Relative: 1 %
Eosinophils Absolute: 0.1 10*3/uL (ref 0.0–0.7)
Eosinophils Relative: 2 %
HCT: 38.5 % (ref 36.0–46.0)
Hemoglobin: 12.9 g/dL (ref 12.0–15.0)
Lymphocytes Relative: 33 %
Lymphs Abs: 2.1 10*3/uL (ref 0.7–4.0)
MCH: 30.1 pg (ref 26.0–34.0)
MCHC: 33.5 g/dL (ref 30.0–36.0)
MCV: 89.7 fL (ref 78.0–100.0)
Monocytes Absolute: 0.8 10*3/uL (ref 0.1–1.0)
Monocytes Relative: 12 %
Neutro Abs: 3.4 10*3/uL (ref 1.7–7.7)
Neutrophils Relative %: 52 %
Platelets: 236 10*3/uL (ref 150–400)
RBC: 4.29 MIL/uL (ref 3.87–5.11)
RDW: 12.4 % (ref 11.5–15.5)
WBC: 6.5 10*3/uL (ref 4.0–10.5)

## 2017-06-22 LAB — LIPASE, BLOOD: Lipase: 33 U/L (ref 11–51)

## 2017-06-22 LAB — HIV ANTIBODY (ROUTINE TESTING W REFLEX): HIV Screen 4th Generation wRfx: NONREACTIVE

## 2017-06-22 LAB — ECHOCARDIOGRAM COMPLETE
Height: 64 in
Weight: 2800 oz

## 2017-06-22 LAB — TROPONIN I: Troponin I: <0.03 ng/mL (ref <0.03)

## 2017-06-22 MED ORDER — IOPAMIDOL (ISOVUE-370) INJECTION 76%
INTRAVENOUS | Status: AC
  Filled 2017-06-22: qty 100

## 2017-06-22 MED ORDER — IOPAMIDOL (ISOVUE-370) INJECTION 76%
100.0000 mL | Freq: Once | INTRAVENOUS | Status: AC | PRN
  Administered 2017-06-22: 100 mL via INTRAVENOUS

## 2017-06-22 NOTE — ED Notes (Signed)
Bed: CX44 Expected date:  Expected time:  Means of arrival:  Comments: Room 3

## 2017-06-22 NOTE — ED Notes (Signed)
Pt ambulatory to restroom

## 2017-06-22 NOTE — Discharge Instructions (Signed)
Syncope Syncope is when you temporarily lose consciousness. Syncope may also be called fainting or passing out. It is caused by a sudden decrease in blood flow to the brain. Even though most causes of syncope are not dangerous, syncope can be a sign of a serious medical problem. Signs that you may be about to faint include:  Feeling dizzy or light-headed.  Feeling nauseous.  Seeing all white or all black in your field of vision.  Having cold, clammy skin.  If you fainted, get medical help right away.Call your local emergency services (911 in the [place] Do not drive yourself to the hospital. Follow these instructions at home: Pay attention to any changes in your symptoms. Take these actions to help with your condition:  Have someone stay with you until you feel stable.  Do not drive, use machinery, or play sports until your health care provider says it is okay.  Keep all follow-up visits as told by your health care provider. This is important.  If you start to feel like you might faint, lie down right away and raise (elevate) your feet above the level of your heart. Breathe deeply and steadily. Wait until all of the symptoms have passed.  Drink enough fluid to keep your urine clear or pale yellow.  If you are taking blood pressure or heart medicine, get up slowly and take several minutes to sit and then stand. This can reduce dizziness.  Take over-the-counter and prescription medicines only as told by your health care provider.  Get help right away if:  You have a severe headache.  You have unusual pain in your chest, abdomen, or back.  You are bleeding from your mouth or rectum, or you have black or tarry stool.  You have a very fast or irregular heartbeat (palpitations).  You have pain with breathing.  You faint once or repeatedly.  You have a seizure.  You are confused.  You have trouble walking.  You have severe weakness.  You have vision problems. These  symptoms may represent a serious problem that is an emergency. Do not wait to see if your symptoms will go away. Get medical help right away. Call your local emergency services (911 in the [place] Do not drive yourself to the hospital. This information is not intended to replace advice given to you by your health care provider. Make sure you discuss any questions you have with your health care provider. Document Released: 01/18/2005 Document Revised: 06/26/2015 Document Reviewed: 10/02/2014 Elsevier Interactive Patient Education  2018 [employer]  

## 2017-06-22 NOTE — Telephone Encounter (Signed)
SENT REFERRAL TO SCHEDULING 

## 2017-06-22 NOTE — Progress Notes (Signed)
  Echocardiogram 2D Echocardiogram has been performed.  Cheryl Mayer 06/22/2017, 9:53 AM

## 2017-06-22 NOTE — Consult Note (Addendum)
Cardiology Consultation:   Patient ID: [name]; [id number]; 05-03-1965   Admit date: 06/21/2017 Date of Consult: 06/22/2017  Primary Care Provider: Christain Sacramento, MD Primary Cardiologist: New-Dr. Burt Knack   Patient Profile:   Cheryl Mayer is a 52 y.o. female with a hx of recent uncontrolled hypertension, migraines and obesity who is being seen today for the evaluation of syncopal episode at the request of Dr. Tamala Julian.   History of Present Illness:   Cheryl Mayer is a 52 year old female with a history stated above who presented to [facility] on 06/21/2017 after a witnessed syncopal episode while out shopping with her daughters.  Patient states she reported feeling lightheaded and short of breath, feeling as if she was going to pass out. She was able to get in the car and was sitting at the time when she reportedly lost consciousness for approximately 1 minute.  She reports the symptoms have been occurring intermittently for approximately 1 month however, were more acutely yesterday during this episode.  She reports associated nausea and diaphoresis with generalized abdominal pain after return to consciousness.  Patient reports over the last 2 weeks she has been having issues with shortness of breath with exertion, one episode of nonradiating, upper chest tightness and episodic mid scapular pain described as "sharp".  She reports a previous history of hypertension diagnosed by her PCP however, did not require medication for this.  Apparently she was in the emergency department 5 days ago for similar symptoms and was prescribed hydrochlorothiazide for elevated blood pressures with SBP reported at greater than 200. She reports taking her blood pressures at home with elevated readings despite compliance with medication.  She was seen by her PCP yesterday 06/20/2017 and was transitioned to lisinopril-hydrochlorothiazide combination due to persistently elevated blood pressures, which is new for  her.  She reports taking first dose of this medication the morning of her syncopal episode. She has been taking phentermine for the last 2 to 3 years with no adverse effects.  She reports seeing her PCP every 6 months for blood pressure checks with no evidence of acute hypertension.  Due to her symptoms, she states that she has not taken the medication for approximately 1 week. Patient denies alcohol or tobacco use. She does report pre-menopausal symptoms at times.  She works at [employer] as a Therapist, music.  She has a family history of CAD in her father and mother.  Father with significant vascular disease and mother with a stroke.   In the emergency department, EKG revealed normal sinus rhythm HR 83 with no acute ischemic ST-T wave abnormalities.  Troponin levels have been negative (<0.03, <0.03).  Creatinine was noted to be elevated at 1.47 likely secondary to dehydration which is now resolved to 0.81 after IV fluids.  Chest CT angiogram performed given resenting symptoms with normal fluoroscopy, abdominal aorta without acute aortic abnormality, no acute chest, abdomen or pelvic findings.  Small 4 mm right upper lobe pulmonary nodule, possibly an intrapulmonary lymph node but nonspecific found on chest CT angiogram with recommendations for no follow-up needed if low risk.  Noncontrast chest CT can be considered within 12 months if patient is high risk.  Cardiology was consulted given her syncopal episode and reported elevated BPs.  Past Medical History:  Diagnosis Date  . HTN (hypertension)   . Migraines   . Obesity     Past Surgical History:  Procedure Laterality Date  . ABDOMINAL HYSTERECTOMY  Prior to Admission medications   Medication Sig Start Date End Date Taking? Authorizing Provider  acetaminophen (TYLENOL) 325 MG tablet Take 650 mg by mouth every 6 (six) hours as needed for mild pain.    Yes [provider]  aspirin-acetaminophen-caffeine (EXCEDRIN MIGRAINE)  [phone] MG tablet Take 1 tablet by mouth every 6 (six) hours as needed for headache.   Yes [provider]  hydrochlorothiazide (HYDRODIURIL) 25 MG tablet Take 1 tablet (25 mg total) by mouth daily. 06/15/17  Yes Fransico Meadow, PA-C  lisinopril-hydrochlorothiazide (PRINZIDE,ZESTORETIC) 20-25 MG tablet Take 1 tablet by mouth daily. 06/20/17  Yes [provider]  naproxen sodium (ALEVE) 220 MG tablet Take 220 mg by mouth 2 (two) times daily as needed (pain).   Yes [provider]  phentermine (ADIPEX-P) 37.5 MG tablet Take 37.5 mg by mouth every morning. 06/13/17  Yes [provider]  topiramate (TOPAMAX) 25 MG tablet Take 25 mg by mouth daily. 06/12/17  Yes [provider]    Inpatient Medications: Scheduled Meds: . enoxaparin (LOVENOX) injection  40 mg Subcutaneous QHS  . iopamidol      . topiramate  25 mg Oral Daily   Continuous Infusions: . sodium chloride 100 mL/hr at 06/21/17 2352   PRN Meds: acetaminophen **OR** acetaminophen, hydrALAZINE, ondansetron **OR** ondansetron (ZOFRAN) IV  Allergies:   No Known Allergies  Social History:   Social History   Socioeconomic History  . Marital status: Married    Spouse name: Not on file  . Number of children: Not on file  . Years of education: Not on file  . Highest education level: Not on file  Occupational History  . Not on file  Social Needs  . Financial resource strain: Not on file  . Food insecurity:    Worry: Not on file    Inability: Not on file  . Transportation needs:    Medical: Not on file    Non-medical: Not on file  Tobacco Use  . Smoking status: Never Smoker  . Smokeless tobacco: Never Used  Substance and Sexual Activity  . Alcohol use: No  . Drug use: No  . Sexual activity: Yes  Lifestyle  . Physical activity:    Days per week: Not on file    Minutes per session: Not on file  . Stress: Not on file  Relationships  . Social connections:    Talks on phone: Not  on file    Gets together: Not on file    Attends religious service: Not on file    Active member of club or organization: Not on file    Attends meetings of clubs or organizations: Not on file    Relationship status: Not on file  . Intimate partner violence:    Fear of current or ex partner: Not on file    Emotionally abused: Not on file    Physically abused: Not on file    Forced sexual activity: Not on file  Other Topics Concern  . Not on file  Social History Narrative  . Not on file    Family History:   Family History  Problem Relation Age of Onset  . Breast cancer Paternal Grandmother 55   Family Status:  Family Status  Relation Name Status  . PGM  (Not Specified)    ROS:  Please see the history of present illness.  All other ROS reviewed and negative.     Physical Exam/Data:   Vitals:   06/22/17 0600 06/22/17 8502  06/22/17 0746 06/22/17 0800  BP: 119/72 112/73 120/69 113/65  Pulse: 77 73 77 60  Resp: _0 Temp:      TempSrc:      SpO2: 100% 99% 100% 98%  Weight:      Height:       No intake or output data in the 24 hours ending 06/22/17 0854 Filed Weights   06/21/17 1507  Weight: 175 lb (79.4 kg)   Body mass index is 30.04 kg/m.   General: Overweight, well developed, well nourished, NAD Skin: Warm, dry, intact  Head: Normocephalic, atraumatic, clear, moist mucus membranes. Neck: Negative for carotid bruits. No JVD Lungs:Clear to ausculation bilaterally. No wheezes, rales, or rhonchi. Breathing is unlabored. Cardiovascular: RRR with S1 S2. No murmurs, rubs or gallops Abdomen: Soft, non-tender, non-distended with normoactive bowel sounds. No obvious abdominal masses. MSK: Strength and tone appear normal for age. 5/5 in all extremities Extremities: No edema. No clubbing or cyanosis. DP/PT pulses 2+ bilaterally Neuro: Alert and oriented. No focal deficits. No facial asymmetry. MAE spontaneously. Psych: Responds to questions appropriately with  normal affect.     EKG:  The EKG was personally reviewed and demonstrates: 06/21/2017 NSR HR 83 with no acute ischemic ST-T wave abnormalities Telemetry:  Telemetry was personally reviewed and demonstrates: 06/22/2017 NSR HR 79  Relevant CV Studies:  ECHO: Echocardiogram 06/22/2016: Pending results  CATH: None  Laboratory Data:  Chemistry Recent Labs  Lab 06/21/17 1617 06/22/17 0659  NA 138 136  K 3.3* 3.6  CL 102 104  CO2 24 23  GLUCOSE 132* 128*  BUN 36* 26*  CREATININE 1.47* 0.81  CALCIUM 9.1 8.8*  GFRNONAA 40* >60  GFRAA 46* >60  ANIONGAP 12 9    Total Protein  Date Value Ref Range Status  06/22/2017 6.8 6.5 - 8.1 g/dL Final   Albumin  Date Value Ref Range Status  06/22/2017 3.7 3.5 - 5.0 g/dL Final   AST  Date Value Ref Range Status  06/22/2017 23 15 - 41 U/L Final   ALT  Date Value Ref Range Status  06/22/2017 21 14 - 54 U/L Final   Alkaline Phosphatase  Date Value Ref Range Status  06/22/2017 61 38 - 126 U/L Final   Total Bilirubin  Date Value Ref Range Status  06/22/2017 1.0 0.3 - 1.2 mg/dL Final   Hematology Recent Labs  Lab 06/21/17 1617 06/22/17 0659  WBC 10.8* 6.5  RBC 4.55 4.29  HGB 13.5 12.9  HCT 40.5 38.5  MCV 89.0 89.7  MCH 29.7 30.1  MCHC 33.3 33.5  RDW 12.1 12.4  PLT 260 236   Cardiac Enzymes Recent Labs  Lab 06/21/17 2013 06/22/17 0659  TROPONINI <0.03 <0.03    Recent Labs  Lab 06/15/17 1148  TROPIPOC 0.01    BNPNo results for input(s): BNP, PROBNP in the last 168 hours.  DDimer  Recent Labs  Lab 06/15/17 1141 06/21/17 2013  DDIMER <0.27 <0.27   TSH: No results found for: TSH Lipids:No results found for: CHOL, HDL, LDLCALC, LDLDIRECT, TRIG, CHOLHDL HgbA1c:No results found for: HGBA1C  Radiology/Studies:  Ct Head Wo Contrast  Result Date: 06/21/2017 CLINICAL DATA:  52 year old female with a history of hypertension and headache EXAM: CT HEAD WITHOUT CONTRAST TECHNIQUE: Contiguous axial images were obtained  from the base of the skull through the vertex without intravenous contrast. COMPARISON:  None. FINDINGS: Brain: No acute intracranial hemorrhage. No midline shift or mass effect. Gray-white differentiation maintained. Unremarkable appearance of the  ventricular system. Vascular: Unremarkable. Skull: No acute fracture.  No aggressive bone lesion identified. Sinuses/Orbits: Unremarkable appearance of the orbits. Mastoid air cells clear. No middle ear effusion. No significant sinus disease. Other: None IMPRESSION: Negative head CT Electronically Signed   By: Corrie Mckusick D.O.   On: 06/21/2017 16:34   Ct Angio Chest/abd/pel For Dissection W And/or W/wo  Result Date: 06/22/2017 CLINICAL DATA:  Syncope.  Intermediate CHD risk. EXAM: CT ANGIOGRAPHY CHEST, ABDOMEN AND PELVIS TECHNIQUE: Multidetector CT imaging through the chest, abdomen and pelvis was performed using the standard protocol during bolus administration of intravenous contrast. Multiplanar reconstructed images and MIPs were obtained and reviewed to evaluate the vascular anatomy. CONTRAST:  159m ISOVUE-370 IOPAMIDOL (ISOVUE-370) INJECTION 76% COMPARISON:  Chest radiograph 06/15/2017 FINDINGS: CTA CHEST FINDINGS Cardiovascular: Normal caliber thoracic aorta without dissection, aneurysm, hematoma or acute aortic syndrome. No central pulmonary embolus to the lobar level. Heart is normal in size. No pericardial effusion. Mediastinum/Nodes: No enlarged mediastinal or hilar lymph nodes. The esophagus is decompressed. Thyroid gland is normal. Lungs/Pleura: Clear lungs. No consolidation, pulmonary edema or pleural effusion. Tiny 4 mm perifissural right upper lobe nodule image 53 series 8. Musculoskeletal: There are no acute or suspicious osseous abnormalities. Scattered small Schmorl's nodes in the thoracic spine. Review of the MIP images confirms the above findings. CTA ABDOMEN AND PELVIS FINDINGS VASCULAR Aorta: Normal caliber aorta without aneurysm, dissection,  vasculitis or significant stenosis. Celiac: Patent without evidence of aneurysm, dissection, vasculitis or significant stenosis. SMA: Patent without evidence of aneurysm, dissection, vasculitis or significant stenosis. Renals: Both renal arteries are patent without evidence of aneurysm, dissection, vasculitis, fibromuscular dysplasia or significant stenosis. IMA: Patent without evidence of aneurysm, dissection, vasculitis or significant stenosis. Inflow: Patent without evidence of aneurysm, dissection, vasculitis or significant stenosis. Veins: No obvious venous abnormality within the limitations of this arterial phase study. Review of the MIP images confirms the above findings. NON-VASCULAR Hepatobiliary: No focal hepatic lesion on arterial phase imaging. Gallbladder physiologically distended, no calcified stone. No biliary dilatation. Pancreas: No ductal dilatation or inflammation. Spleen: Normal in size.  Normal arterial enhancement. Adrenals/Urinary Tract: Normal adrenal glands. No hydronephrosis or perinephric edema. Mild cortical scarring of the posterior left kidney. Urinary bladder is physiologically distended. Stomach/Bowel: Stomach is within normal limits. Appendix is not confidently visualized. No evidence of bowel wall thickening, distention, or inflammatory changes. Lymphatic: No enlarged abdominal or pelvic lymph nodes. Reproductive: Status post hysterectomy. No adnexal masses. Other: No ascites or free air. Musculoskeletal: There are no acute or suspicious osseous abnormalities. Review of the MIP images confirms the above findings. IMPRESSION: 1. Normal thoracoabdominal aorta without acute aortic abnormality. 2. No acute findings in the chest, abdomen, or pelvis. 3. Small 4 mm perifissural right upper lobe pulmonary nodule, possibly an intrapulmonary lymph node but nonspecific. No follow-up needed if patient is low-risk. Non-contrast chest CT can be considered in 12 months if patient is high-risk.  This recommendation follows the consensus statement: Guidelines for Management of Incidental Pulmonary Nodules Detected on CT Images: From the Fleischner Society 2017; Radiology 2017; 284:228-243. Electronically Signed   By: MJeb LeveringM.D.   On: 06/22/2017 01:03   Assessment and Plan:   1.  Syncope with loss of consciousness: -Patient presenting after witnessed syncopal episode with loss of consciousness proximal he 1 minute while out shopping yesterday afternoon.  Patient was sitting when episode occurred.  Patient reports feelings of dizziness, shortness of breath and overwhelming fatigue for the last 1 week.  She does  state she has had intermittent mid scapular, sharp pain and one episode of anterior chest pain during this time.  She has a family history of CAD vascular disease in her mother and father. -Troponin, <0.03, <0.03 -EKG with NSR and no acute ischemic ST-T wave abnormalities found.  Patient has no prior history of CAD or anginal episodes. -Echocardiogram pending with no prior study comparisons -Creatinine markedly elevated on presentation which is now resolved after IV fluids consistent with dehydration -Electrolytes, K+ 3.6, Na+ 136 -TSH per PCP report 06/20/2017, 1.564 -Patient reports recent discontinuation of phentermine given her persistent symptoms and reports no prior episodes of markedly elevated blood pressures prior to these events -If recurrent symptoms or evidence of arrhythmias per telemetry, consider OP 30-day monitor to evaluate for arrhythmias.  Likely related to dehydration and phentermine administration, less likely ACS.  We will continue to cycle troponin and evaluate echocardiogram for abnormalities.  If persistent symptoms of chest discomfort could consider further ischemic evaluation.  2.  Mid scapular/chest discomfort: -Patient reports episodes of intermittent mid scapular discomfort along with her shortness of breath for the last several weeks.  He had one  episode of anterior chest pain which was relieved on its own.  Troponin levels have been negative x2, CT angiogram negative for acute findings.  Initial concern for dissection/coarctation given reports of blood pressure discrepancies between right and left arms. -EKG with NSR and no acute ischemic ST-T wave abnormalities found.  Patient has no prior history of CAD or anginal episodes.  3.  Acute kidney injury secondary to dehydration: -Creatinine, 1.47 on admission>>>> resolved to 0.81 after IV fluids consistent with dehydration -Will continue to monitor with be met -Note, when seen in her PCP on 06/20/2017 creatinine 0.89  4.  Hypertension: -Stable, 120/69>112/73>119/72 -Lisinopril-hydrochlorothiazide 20-25 mg currently on hold  5.  History of migraine: -Stable, no reports of recent migraine -Topamax 25 mg daily   For questions or updates, please contact [place] Please consult [email or web address] for contact info under Cardiology/STEMI.   Lyndel Safe NP-C [place] Pager: [phone] 06/22/2017 8:54 AM  Patient seen, examined. Available data reviewed. Agree with findings, assessment, and plan as outlined by Kathyrn Drown, NP.   On my exam today the patient is alert and oriented, in no distress.  JVP is normal, there is a left carotid bruit, heart is regular rate and rhythm with a 2/6 SEM at the RUSB, abdomen soft nontender, lungs are clear, there is no peripheral edema, neurologic is grossly intact with equal strength bilaterally.  I have personally reviewed the patient's echo images.  The formal interpretation is pending.  LV function is normal with an LVEF of greater than 60%.  There is no significant valvular dysfunction.  There is no pericardial effusion.  The patient's blood pressure is now in the normal range off of all antihypertensive medication.  Acute kidney injury has resolved with fluid hydration.  I agree with documentation above that the patient's most likely  etiology of his syncope is related to iatrogenic causes with antihypertensive medication and relative volume depletion.  Cardiac dysrhythmia is much less likely and I do not think this warrants any outpatient monitoring at this time.  The patient does have some atypical symptoms with exertion with associated mid scapular pain.  Her EKG shows sinus rhythm with ST/T wave changes there are nonspecific but raise the possibility of inferolateral ischemia.  I would recommend an outpatient exercise Myoview scan for further evaluation.  Recommend:  Outpatient exercise Myoview stress  test as above  Carotid ultrasound to evaluate left carotid bruit  Renal arterial duplex to evaluate new onset hypertension in patient who presents with syncope and AKI after a single dose of lisinopril/HCT  Orthostatic vital signs this morning.  If orthostatics negative, the studies can be completed as an outpatient.  Will expedite them as much as possible per family request.  Otherwise appears stable for discharge from a cardiac perspective.  The patient should avoid antihypertensive medication.  She should avoid phentermine which she has now discontinued on her own.  Sherren Mocha, M.D. 06/22/2017 10:29 AM

## 2017-06-23 NOTE — Discharge Summary (Signed)
[facility] Discharge Summary   Patient: Cheryl Mayer VEH:[id number]   PCP: Christain Sacramento, MD DOB: [birth year]   Date of admission: 06/21/2017   Date of discharge: 06/22/2017   Discharge Diagnoses:  Principal Problem:   Syncope Active Problems:   Essential hypertension   Leukocytosis   AKI (acute kidney injury) ([place])   Dyspnea   Admitted From: home Disposition:  home  Recommendations for Outpatient Follow-up:  1. Please follow-up with PCP in 1 week, preferably on next Monday. 2. Please follow-up with cardiology as recommended. 3. Patient recommended not to drive for next 3 days, patient can resume driving if there is no episodes of syncope or dizziness next week once cleared by PCP.  Follow-up Information    Christain Sacramento, MD. Schedule an appointment as soon as possible for a visit in 1 week(s).   Specialty:  Family Medicine Contact information: [address] [place] [address] [phone]        Sherren Mocha, MD Follow up.   Specialty:  Cardiology Contact information: [address] [address] [phone]        Sherren Mocha, MD .   Specialty:  Cardiology Contact information: [place] [address] [phone]          Diet recommendation: Cardiac diet  Activity: The patient is advised to gradually reintroduce usual activities.  Discharge Condition: good  Code Status: full code  History of present illness: As per the H and P dictated on admission, "Cheryl Mayer is a 52 y.o. female with medical history significant of HTN and obesity; who presents after having a syncopal episode.  Patient had been out with her daughter today when she reported feeling lightheaded and short of breath.  Patient was able to get to the car and was sitting at the time when she reportedly lost consciousness.  Associated symptoms included malaise, generalized abdominal pain, nausea,  headache, palpitations, and diaphoresis.  Over the last 2 weeks patient reports having issues with shortness of breath with exertion, chest tightness, easy bruisability, and pain in between her scapula.  She previously notes a history of hypertension, but has not required medication for treatment.  She was seen in the emergency department 5 days ago for her symptoms at that time was started on hydrochlorothiazide after blood pressures were noted to be elevated.  Since that time patient has been taking her blood pressure medication as advised.despite doing these interventions patient reports her systolic blood pressures at home have been elevated into the 180s with a reported 20 point difference between arms.  She is seen by her PCP yesterday, and changed to a lisinopril-hydrochlorothiazide combo medication due to persistently elevated  blood pressures. Lab work was also obtained at that time patient was noted to have normal thyroid levels.  Patient reports stopping phentermine which she was taking for focus approximately 1 week ago due to her symptoms.  Denies any use of alcohol, tobacco, recent travel, calf pain, leg swelling, hormone replacement, or change in weight."  Hospital Course:  Summary of her active problems in the hospital is as following. Syncope: Acute.  Presents after having syncopal episode. Symptoms and labs give concern for vasovagal syncope. D-dimer was negative so less concern for pulmonary embolus. orthostatic vital signs negative x2.  Patient was ablated without any symptoms. Serial troponins are negative. Echocardiogram showed preserved EF, no significant wall motion abnormality or no significant valvular abnormality. No recurrence of  the symptoms in the hospital. Telemetry monitoring was performed and no significant arrhythmias noted as well. Cardiology was consulted and highly appreciate their input. At present do not feel that the patient requires outpatient long-term  monitoring. Suspect that her syncope episode was likely secondary to iatrogenic lowering of her blood pressure with the blood pressure medications. Momentarily rising her blood pressure a few days ago which led to addition of HCTZ followed by lisinopril HCTZ is likely secondary to stress and phentermine. Cardiology is recommending outpatient work-up for renal hypertension.    Scapular discomfort: Acute.  Patient reports having intermittent scalp discomfort along with shortness of breath symptoms.   LFT and lipase unremarkable. CT angiogram of chest abdomen pelvis was negative for any dissection. Also negative for any other acute abnormality. Pain resolved on its own. No further work-up at present. Serial troponins are negative, EKG unremarkable.  D-dimer negative.  Acute kidney injury 2/2 dehyradation: Baseline kidney function was noted to be within normal limits, but presents with creatinine 1.47 and BUN 36. Resolved with IV fluids and hydration and holding off on lisinopril and hydrochlorothiazide. Recommend to continue hold the blood pressure medications at home.  Essential hypertension: Blood pressures noted to be in the 109/64-127/68 on admission while patient lying down. - Hold lisinopril-hydrochlorothiazide due to acute kidney injury   Dyspnea: No clear cause for patient's symptoms noted.  Chest x-ray previously noted to be clear.    Leukocytosis: Acute.  WBC elevated 10.8.  Symptoms could be reactive in nature.  No clear source of infection noted.  Incidental finding of pulmonary nodule. CT scan reveals a 4 mm pulmonary nodule on the right. Recommended repeat CT scan in 12 months.   All other chronic medical condition were stable during the hospitalization.  Patient was ambulatory without any assistance. On the day of the discharge the patient's vitals were stable, and no other acute medical condition were reported by patient. the patient was felt safe to be discharge at  home with family.  Consultants: cardiology  Procedures: Echocardiogram  Study Conclusions  - Left ventricle: The cavity size was normal. There was mild   concentric hypertrophy. Systolic function was vigorous. The   estimated ejection fraction was in the range of 65% to 70%. Wall   motion was normal; there were no regional wall motion   abnormalities. Doppler parameters are consistent with abnormal   left ventricular relaxation (grade 1 diastolic dysfunction).   Doppler parameters are consistent with elevated ventricular   end-diastolic filling pressure. - Aortic valve: There was mild regurgitation. - Aortic root: The aortic root was normal in size. - Mitral valve: There was no regurgitation. - Right ventricle: Systolic function was normal. - Tricuspid valve: There was no regurgitation. - Pulmonary arteries: Systolic pressure could not be accurately   estimated. - Inferior vena cava: The vessel was normal in size. - Pericardium, extracardiac: There was no pericardial effusion.  DISCHARGE MEDICATION: Allergies as of 06/22/2017   No Known Allergies     Medication List    STOP taking these medications   hydrochlorothiazide 25 MG tablet Commonly known as:  HYDRODIURIL   lisinopril-hydrochlorothiazide 20-25 MG tablet Commonly known as:  PRINZIDE,ZESTORETIC   naproxen sodium 220 MG tablet Commonly known as:  ALEVE   phentermine 37.5 MG tablet Commonly known as:  ADIPEX-P     TAKE these medications   acetaminophen 325 MG tablet Commonly known as:  TYLENOL Take 650 mg by mouth every 6 (six) hours as needed for mild pain.  aspirin-acetaminophen-caffeine 250-250-65 MG tablet Commonly known as:  EXCEDRIN MIGRAINE Take 1 tablet by mouth every 6 (six) hours as needed for headache.   topiramate 25 MG tablet Commonly known as:  TOPAMAX Take 25 mg by mouth daily.      No Known Allergies Discharge Instructions    Diet - low sodium heart healthy   Complete by:  As  directed    Discharge instructions   Complete by:  As directed    It is important that you read following instructions as well as go over your medication list with RN to help you understand your care after this hospitalization.  Discharge Instructions: Please follow-up with PCP in one week  Please request your primary care physician to go over all Hospital Tests and Procedure/Radiological results at the follow up,  Please get all Hospital records sent to your PCP by signing hospital release before you go home.   Do not drive, operating heavy machinery, perform activities at heights, swimming or participation in water activities or provide baby sitting services as your were admitted for syncope, until you have been seen by Primary Care Physician or a Neurologist and advised to do so again. Do not take more than prescribed Pain, Sleep and Anxiety Medications. You were cared for by a hospitalist during your hospital stay. If you have any questions about your discharge medications or the care you received while you were in the hospital after you are discharged, you can call the unit and ask to speak with the hospitalist on call if the hospitalist that took care of you is not available.  Once you are discharged, your primary care physician will handle any further medical issues. Please note that NO REFILLS for any discharge medications will be authorized once you are discharged, as it is imperative that you return to your primary care physician (or establish a relationship with a primary care physician if you do not have one) for your aftercare needs so that they can reassess your need for medications and monitor your lab values. You Must read complete instructions/literature along with all the possible adverse reactions/side effects for all the Medicines you take and that have been prescribed to you. Take any new Medicines after you have completely understood and accept all the possible adverse  reactions/side effects. Wear Seat belts while driving. If you have smoked or chewed Tobacco in the last 2 yrs please stop smoking and/or stop any Recreational drug use.   Driving Restrictions   Complete by:  As directed    Avoid driving, if not further syncope can resume driving on Monday 78/93/8101   Increase activity slowly   Complete by:  As directed      Discharge Exam: Filed Weights   06/21/17 1507  Weight: 79.4 kg (175 lb)   Vitals:   06/22/17 1301 06/22/17 1400  BP: (!) 148/105 132/61  Pulse: 79 70  Resp: (!) 26 13  Temp:    SpO2: 100% 98%   General: Appear in no distress, no Rash; Oral Mucosa moist. Cardiovascular: S1 and S2 Present, no Murmur, no JVD Respiratory: Bilateral Air entry present and Clear to Auscultation, no Crackles, no wheezes Abdomen: Bowel Sound present, Soft and no tenderness Extremities: no Pedal edema, no calf tenderness Neurology: Grossly no focal neuro deficit.  The results of significant diagnostics from this hospitalization (including imaging, microbiology, ancillary and laboratory) are listed below for reference.    Significant Diagnostic Studies: Dg Chest 2 View  Result Date: 06/15/2017 CLINICAL DATA:  Shortness of breath over the last week, some shoulder pain EXAM: CHEST - 2 VIEW COMPARISON:  None. FINDINGS: No active infiltrate or effusion is seen. Mediastinal and hilar contours are unremarkable. The heart is within normal limits in size. No bony abnormality is seen. IMPRESSION: No active cardiopulmonary disease. Electronically Signed   By: Ivar Drape M.D.   On: 06/15/2017 07:59   Ct Head Wo Contrast  Result Date: 06/21/2017 CLINICAL DATA:  52 year old female with a history of hypertension and headache EXAM: CT HEAD WITHOUT CONTRAST TECHNIQUE: Contiguous axial images were obtained from the base of the skull through the vertex without intravenous contrast. COMPARISON:  None. FINDINGS: Brain: No acute intracranial hemorrhage. No midline shift  or mass effect. Gray-white differentiation maintained. Unremarkable appearance of the ventricular system. Vascular: Unremarkable. Skull: No acute fracture.  No aggressive bone lesion identified. Sinuses/Orbits: Unremarkable appearance of the orbits. Mastoid air cells clear. No middle ear effusion. No significant sinus disease. Other: None IMPRESSION: Negative head CT Electronically Signed   By: Corrie Mckusick D.O.   On: 06/21/2017 16:34   Ct Angio Chest/abd/pel For Dissection W And/or W/wo  Result Date: 06/22/2017 CLINICAL DATA:  Syncope.  Intermediate CHD risk. EXAM: CT ANGIOGRAPHY CHEST, ABDOMEN AND PELVIS TECHNIQUE: Multidetector CT imaging through the chest, abdomen and pelvis was performed using the standard protocol during bolus administration of intravenous contrast. Multiplanar reconstructed images and MIPs were obtained and reviewed to evaluate the vascular anatomy. CONTRAST:  144mL ISOVUE-370 IOPAMIDOL (ISOVUE-370) INJECTION 76% COMPARISON:  Chest radiograph 06/15/2017 FINDINGS: CTA CHEST FINDINGS Cardiovascular: Normal caliber thoracic aorta without dissection, aneurysm, hematoma or acute aortic syndrome. No central pulmonary embolus to the lobar level. Heart is normal in size. No pericardial effusion. Mediastinum/Nodes: No enlarged mediastinal or hilar lymph nodes. The esophagus is decompressed. Thyroid gland is normal. Lungs/Pleura: Clear lungs. No consolidation, pulmonary edema or pleural effusion. Tiny 4 mm perifissural right upper lobe nodule image 53 series 8. Musculoskeletal: There are no acute or suspicious osseous abnormalities. Scattered small Schmorl's nodes in the thoracic spine. Review of the MIP images confirms the above findings. CTA ABDOMEN AND PELVIS FINDINGS VASCULAR Aorta: Normal caliber aorta without aneurysm, dissection, vasculitis or significant stenosis. Celiac: Patent without evidence of aneurysm, dissection, vasculitis or significant stenosis. SMA: Patent without evidence of  aneurysm, dissection, vasculitis or significant stenosis. Renals: Both renal arteries are patent without evidence of aneurysm, dissection, vasculitis, fibromuscular dysplasia or significant stenosis. IMA: Patent without evidence of aneurysm, dissection, vasculitis or significant stenosis. Inflow: Patent without evidence of aneurysm, dissection, vasculitis or significant stenosis. Veins: No obvious venous abnormality within the limitations of this arterial phase study. Review of the MIP images confirms the above findings. NON-VASCULAR Hepatobiliary: No focal hepatic lesion on arterial phase imaging. Gallbladder physiologically distended, no calcified stone. No biliary dilatation. Pancreas: No ductal dilatation or inflammation. Spleen: Normal in size.  Normal arterial enhancement. Adrenals/Urinary Tract: Normal adrenal glands. No hydronephrosis or perinephric edema. Mild cortical scarring of the posterior left kidney. Urinary bladder is physiologically distended. Stomach/Bowel: Stomach is within normal limits. Appendix is not confidently visualized. No evidence of bowel wall thickening, distention, or inflammatory changes. Lymphatic: No enlarged abdominal or pelvic lymph nodes. Reproductive: Status post hysterectomy. No adnexal masses. Other: No ascites or free air. Musculoskeletal: There are no acute or suspicious osseous abnormalities. Review of the MIP images confirms the above findings. IMPRESSION: 1. Normal thoracoabdominal aorta without acute aortic abnormality. 2. No acute findings in the chest, abdomen, or pelvis. 3. Small 4 mm perifissural  right upper lobe pulmonary nodule, possibly an intrapulmonary lymph node but nonspecific. No follow-up needed if patient is low-risk. Non-contrast chest CT can be considered in 12 months if patient is high-risk. This recommendation follows the consensus statement: Guidelines for Management of Incidental Pulmonary Nodules Detected on CT Images: From the Fleischner Society  2017; Radiology 2017; 284:228-243. Electronically Signed   By: Jeb Levering M.D.   On: 06/22/2017 01:03    Microbiology: No results found for this or any previous visit (from the past 240 hour(s)).   Labs: CBC: Recent Labs  Lab 06/21/17 1617 06/22/17 0659  WBC 10.8* 6.5  NEUTROABS  --  3.4  HGB 13.5 12.9  HCT 40.5 38.5  MCV 89.0 89.7  PLT 260 287   Basic Metabolic Panel: Recent Labs  Lab 06/21/17 1617 06/22/17 0659  NA 138 136  K 3.3* 3.6  CL 102 104  CO2 24 23  GLUCOSE 132* 128*  BUN 36* 26*  CREATININE 1.47* 0.81  CALCIUM 9.1 8.8*   Liver Function Tests: Recent Labs  Lab 06/22/17 0659  AST 23  ALT 21  ALKPHOS 61  BILITOT 1.0  PROT 6.8  ALBUMIN 3.7   Recent Labs  Lab 06/22/17 0659  LIPASE 33   No results for input(s): AMMONIA in the last 168 hours. Cardiac Enzymes: Recent Labs  Lab 06/21/17 2013 06/22/17 0659  TROPONINI <0.03 <0.03   BNP (last 3 results) No results for input(s): BNP in the last 8760 hours. CBG: Recent Labs  Lab 06/21/17 1535  GLUCAP 120*   Time spent: 35 minutes  Signed:  Berle Mull  [facility] 06/22/2017 , 8:34 AM

## 2017-06-28 DIAGNOSIS — R0989 Other specified symptoms and signs involving the circulatory and respiratory systems: Secondary | ICD-10-CM | POA: Insufficient documentation

## 2017-06-30 ENCOUNTER — Telehealth (HOSPITAL_COMMUNITY): Payer: Self-pay | Admitting: Radiology

## 2017-06-30 ENCOUNTER — Telehealth (HOSPITAL_COMMUNITY): Payer: Self-pay | Admitting: *Deleted

## 2017-06-30 NOTE — Telephone Encounter (Signed)
Left message on voicemail in reference to upcoming appointment scheduled for 07/05/17. Phone number given for a call back so details instructions can be given. Cheryl Mayer

## 2017-06-30 NOTE — Telephone Encounter (Signed)
Patient given detailed instructions per Myocardial Perfusion Study Information Sheet for the test on 07/05/2017 at 10:30. Patient notified to arrive 15 minutes early and that it is imperative to arrive on time for appointment to keep from having the test rescheduled.  If you need to cancel or reschedule your appointment, please call the office within 24 hours of your appointment. . Patient verbalized understanding.EHK

## 2017-07-01 ENCOUNTER — Other Ambulatory Visit: Payer: Self-pay | Admitting: Cardiology

## 2017-07-01 DIAGNOSIS — R0989 Other specified symptoms and signs involving the circulatory and respiratory systems: Secondary | ICD-10-CM

## 2017-07-01 DIAGNOSIS — I6523 Occlusion and stenosis of bilateral carotid arteries: Secondary | ICD-10-CM

## 2017-07-05 ENCOUNTER — Ambulatory Visit (HOSPITAL_COMMUNITY)
Admission: RE | Admit: 2017-07-05 | Discharge: 2017-07-05 | Disposition: A | Discharge status: Home / Self Care | Payer: BLUE CROSS/BLUE SHIELD | Source: Ambulatory Visit | Attending: Cardiovascular Disease | Admitting: Cardiovascular Disease

## 2017-07-05 ENCOUNTER — Encounter (HOSPITAL_COMMUNITY): Payer: BLUE CROSS/BLUE SHIELD

## 2017-07-05 DIAGNOSIS — R0989 Other specified symptoms and signs involving the circulatory and respiratory systems: Secondary | ICD-10-CM | POA: Diagnosis not present

## 2017-07-05 DIAGNOSIS — I6523 Occlusion and stenosis of bilateral carotid arteries: Secondary | ICD-10-CM | POA: Insufficient documentation

## 2017-07-05 DIAGNOSIS — I10 Essential (primary) hypertension: Secondary | ICD-10-CM | POA: Insufficient documentation

## 2017-07-05 DIAGNOSIS — R079 Chest pain, unspecified: Secondary | ICD-10-CM | POA: Diagnosis not present

## 2017-07-05 LAB — MYOCARDIAL PERFUSION IMAGING
LV dias vol: 97 mL (ref 46–106)
LV sys vol: 43 mL
Peak HR: 101 {beats}/min
Rest HR: 73 {beats}/min
SDS: 1
SRS: 0
SSS: 1
TID: 1.06

## 2017-07-05 MED ORDER — TECHNETIUM TC 99M TETROFOSMIN IV KIT
10.1000 | PACK | Freq: Once | INTRAVENOUS | Status: DC | PRN
  Filled 2017-07-05: qty 11

## 2017-07-05 MED ORDER — TECHNETIUM TC 99M TETROFOSMIN IV KIT
30.5000 | PACK | Freq: Once | INTRAVENOUS | Status: DC | PRN
  Filled 2017-07-05: qty 31

## 2017-07-05 MED ORDER — REGADENOSON 0.4 MG/5ML IV SOLN: 0.4000 mg | Freq: Once | INTRAVENOUS | Status: DC

## 2017-07-06 ENCOUNTER — Telehealth: Payer: Self-pay | Admitting: Cardiovascular Disease

## 2017-07-06 NOTE — Telephone Encounter (Signed)
New message    Patient was calling back for her test results , please leave results on her voicemail if she does not answer.

## 2017-07-06 NOTE — Telephone Encounter (Signed)
See result notes. 

## 2017-07-12 ENCOUNTER — Telehealth: Payer: Self-pay | Admitting: Cardiovascular Disease

## 2017-07-12 NOTE — Telephone Encounter (Signed)
New Message    Pt would like to switch from Dr. Burt Knack to Dr. Geraldo Pitter. Please advise

## 2017-07-12 NOTE — Telephone Encounter (Signed)
Of course - fine with me. thx

## 2017-08-09 ENCOUNTER — Ambulatory Visit (INDEPENDENT_AMBULATORY_CARE_PROVIDER_SITE_OTHER): Payer: BLUE CROSS/BLUE SHIELD | Admitting: Cardiology

## 2017-08-09 ENCOUNTER — Ambulatory Visit: Payer: BLUE CROSS/BLUE SHIELD | Admitting: Physician Assistant

## 2017-08-09 ENCOUNTER — Encounter: Payer: Self-pay | Admitting: Cardiology

## 2017-08-09 DIAGNOSIS — R0789 Other chest pain: Secondary | ICD-10-CM

## 2017-08-09 NOTE — Patient Instructions (Signed)
Medication Instructions:  Your physician recommends that you continue on your current medications as directed. Please refer to the Current Medication list given to you today.  Labwork: None ordered  Testing/Procedures: None ordered  Follow-Up: Your physician recommends that you schedule a follow-up appointment in: 6 months with Dr. Geraldo Pitter. You will receive a letter in the mail to schedule.   Any Other Special Instructions Will Be Listed Below (If Applicable).     If you need a refill on your cardiac medications before your next appointment, please call your pharmacy.

## 2017-08-09 NOTE — Progress Notes (Signed)
Cardiology Office Note:    Date:  08/09/2017   ID:  Cheryl Mayer, DOB 01-03-1966, MRN [id number]  PCP:  Cheryl Sacramento, MD  Cardiologist:  Cheryl Lindau, MD   Referring MD: Cheryl Sacramento, MD    ASSESSMENT:    1. Chest discomfort    PLAN:    In order of problems listed above:  1. Primary prevention stressed with the patient.  Importance of compliance with diet stressed and she vocalized understanding.  I told her to embark on an exercise program and to lose about 20 pounds in the next coming 1 year.  She vocalized understanding and plans to do so aggressively.  I told her to walk at least 30 minutes a day at least 5 days a week brisk walking and she agrees to do so.  She appears motivated. 2. Her blood pressure is stable. 3. I reviewed her echocardiogram which reveals mild aortic regurgitation and discussed the findings with her as benign at this time and that we will keep a track of this on the long run.  She is happy to know this.  Also discussed CT scan results.  There is no evidence of coronary calcification on the CT scan.  Her stress test has been unremarkable and I reassured her about this. 4. Patient will be seen in follow-up appointment in 6 months or earlier if the patient has any concerns    Medication Adjustments/Labs and Tests Ordered: Current medicines are reviewed at length with the patient today.  Concerns regarding medicines are outlined above.  No orders of the defined types were placed in this encounter.  No orders of the defined types were placed in this encounter.    Chief Complaint  Patient presents with  . Hospitalization Follow-up     History of Present Illness:    Cheryl Mayer is a 52 y.o. female.  She is previously unknown to me.  She was in the emergency room for chest discomfort for more so shortness of breath which happened suddenly.  CT scan of the chest was unremarkable and ruled out pulmonary embolism.  Subsequently because of  high blood pressure in an emergency setting she was given blood pressure medications and her blood pressure bottomed out and she called 911 for the same reason.  At that time her blood pressure has been fine and the medications have been discontinued.  Subsequently she has done fine.  She does not exercise on a regular basis.  Her shortness of breath is better.  She mentions to me that she mows her yard and her dad's yard with a self-propelled mower without any symptoms.  At the time of my evaluation, the patient is alert awake oriented and in no distress.  She had multiple questions about all the testing done recently and had multiple questions which were answered to her satisfaction.  Past Medical History:  Diagnosis Date  . HTN (hypertension)   . Migraines   . Obesity   . Syncope     Past Surgical History:  Procedure Laterality Date  . ABDOMINAL HYSTERECTOMY      Current Medications: Current Meds  Medication Sig  . acetaminophen (TYLENOL) 325 MG tablet Take 650 mg by mouth every 6 (six) hours as needed for mild pain.   Marland Kitchen aspirin-acetaminophen-caffeine (EXCEDRIN MIGRAINE) 250-250-65 MG tablet Take 1 tablet by mouth every 6 (six) hours as needed for headache.  . topiramate (TOPAMAX) 25 MG tablet Take 25 mg by mouth daily.  Allergies:   Patient has no known allergies.   Social History   Socioeconomic History  . Marital status: Married    Spouse name: Not on file  . Number of children: Not on file  . Years of education: Not on file  . Highest education level: Not on file  Occupational History  . Not on file  Social Needs  . Financial resource strain: Not on file  . Food insecurity:    Worry: Not on file    Inability: Not on file  . Transportation needs:    Medical: Not on file    Non-medical: Not on file  Tobacco Use  . Smoking status: Never Smoker  . Smokeless tobacco: Never Used  Substance and Sexual Activity  . Alcohol use: No  . Drug use: No  . Sexual activity:  Yes  Lifestyle  . Physical activity:    Days per week: Not on file    Minutes per session: Not on file  . Stress: Not on file  Relationships  . Social connections:    Talks on phone: Not on file    Gets together: Not on file    Attends religious service: Not on file    Active member of club or organization: Not on file    Attends meetings of clubs or organizations: Not on file    Relationship status: Not on file  Other Topics Concern  . Not on file  Social History Narrative  . Not on file     Family History: The patient's family history includes Breast cancer (age of onset: 60) in her paternal grandmother; CAD in her father; Diabetes in her father and mother; Heart attack in her father and mother; Stroke in her mother.  ROS:   Please see the history of present illness.    All other systems reviewed and are negative.  EKGs/Labs/Other Studies Reviewed:    The following studies were reviewed today: EKG reveals sinus rhythm and nonspecific ST-T changes.   Recent Labs: 06/22/2017: ALT 21; BUN 26; Creatinine, Ser 0.81; Hemoglobin 12.9; Platelets 236; Potassium 3.6; Sodium 136  Recent Lipid Panel No results found for: CHOL, TRIG, HDL, CHOLHDL, VLDL, LDLCALC, LDLDIRECT  Physical Exam:    VS:  BP 138/66 (BP Location: Left Arm)   Pulse 62   Ht 5\' 4"  (1.626 m)   Wt 180 lb 12.8 oz (82 kg)   LMP  (LMP Unknown)   SpO2 97%   BMI 31.03 kg/m     Wt Readings from Last 3 Encounters:  08/09/17 180 lb 12.8 oz (82 kg)  07/05/17 175 lb (79.4 kg)  06/21/17 175 lb (79.4 kg)     GEN: Patient is in no acute distress HEENT: Normal NECK: No JVD; No carotid bruits LYMPHATICS: No lymphadenopathy CARDIAC: Hear sounds regular, 2/6 systolic murmur at the apex. RESPIRATORY:  Clear to auscultation without rales, wheezing or rhonchi  ABDOMEN: Soft, non-tender, non-distended MUSCULOSKELETAL:  No edema; No deformity  SKIN: Warm and dry NEUROLOGIC:  Alert and oriented x 3 PSYCHIATRIC:  Normal  affect   Signed, Cheryl Lindau, MD  08/09/2017 11:26 AM    [place]

## 2018-01-17 ENCOUNTER — Other Ambulatory Visit: Payer: Self-pay

## 2018-01-17 ENCOUNTER — Ambulatory Visit (INDEPENDENT_AMBULATORY_CARE_PROVIDER_SITE_OTHER): Payer: BLUE CROSS/BLUE SHIELD | Admitting: Obstetrics and Gynecology

## 2018-01-17 ENCOUNTER — Ambulatory Visit (INDEPENDENT_AMBULATORY_CARE_PROVIDER_SITE_OTHER): Payer: BLUE CROSS/BLUE SHIELD

## 2018-01-17 ENCOUNTER — Encounter: Payer: Self-pay | Admitting: Obstetrics and Gynecology

## 2018-01-17 VITALS — BP 148/72 | Ht 63.75 in | Wt 175.0 lb

## 2018-01-17 VITALS — BP 148/72 | HR 84 | Resp 16 | Ht 63.75 in | Wt 175.0 lb

## 2018-01-17 DIAGNOSIS — Z8632 Personal history of gestational diabetes: Secondary | ICD-10-CM | POA: Diagnosis not present

## 2018-01-17 DIAGNOSIS — R232 Flushing: Secondary | ICD-10-CM | POA: Diagnosis not present

## 2018-01-17 DIAGNOSIS — R102 Pelvic and perineal pain: Secondary | ICD-10-CM | POA: Diagnosis not present

## 2018-01-17 DIAGNOSIS — Z0000 Encounter for general adult medical examination without abnormal findings: Secondary | ICD-10-CM

## 2018-01-17 DIAGNOSIS — K5900 Constipation, unspecified: Secondary | ICD-10-CM

## 2018-01-17 DIAGNOSIS — N3946 Mixed incontinence: Secondary | ICD-10-CM | POA: Diagnosis not present

## 2018-01-17 DIAGNOSIS — Z01419 Encounter for gynecological examination (general) (routine) without abnormal findings: Secondary | ICD-10-CM

## 2018-01-17 DIAGNOSIS — R109 Unspecified abdominal pain: Secondary | ICD-10-CM

## 2018-01-17 DIAGNOSIS — Z1211 Encounter for screening for malignant neoplasm of colon: Secondary | ICD-10-CM

## 2018-01-17 DIAGNOSIS — R197 Diarrhea, unspecified: Secondary | ICD-10-CM

## 2018-01-17 NOTE — Progress Notes (Deleted)
GYNECOLOGY  VISIT   HPI: 52 y.o.   Married White or Caucasian Not Hispanic or Latino  female   [phone] with No LMP recorded (lmp unknown). Patient has had a hysterectomy.   here for ultrasound    GYNECOLOGIC HISTORY: No LMP recorded (lmp unknown). Patient has had a hysterectomy. Contraception:Hysterectomy Menopausal hormone therapy: none        OB History    Gravida  8   Para  6   Term  3   Preterm  0   AB  2   Living  3     SAB  2   TAB  0   Ectopic  0   Multiple  0   Live Births  3              Patient Active Problem List   Diagnosis Date Noted  . Chest discomfort 08/09/2017  . Bruit of left carotid artery 06/28/2017  . Essential hypertension 06/22/2017  . Leukocytosis 06/22/2017  . AKI (acute kidney injury) ([place]) 06/22/2017  . Dyspnea 06/22/2017  . Syncope 06/21/2017  . Benign neoplasm of skin of face 03/07/2017  . Left shoulder pain 08/11/2016  . Benign skin lesion 05/24/2015  . Rosacea 05/24/2015  . Attention deficit disorder of adult 08/02/2014  . Depression 08/02/2014  . Dyshidrotic eczema 08/02/2014  . Migraines 08/02/2014  . Overweight (BMI 25.0-29.9) 08/02/2014  . Symptomatic menopausal or female climacteric states 08/02/2014    Past Medical History:  Diagnosis Date  . Anemia   . Depression   . Gestational diabetes   . HTN (hypertension)   . Migraines   . Obesity   . Pain in female genitalia on intercourse   . Syncope     Past Surgical History:  Procedure Laterality Date  . DILATION AND CURETTAGE OF UTERUS     x2  . LAPAROSCOPIC VAGINAL HYSTERECTOMY      Current Outpatient Medications  Medication Sig Dispense Refill  . acetaminophen (TYLENOL) 325 MG tablet Take 650 mg by mouth every 6 (six) hours as needed for mild pain.     Marland Kitchen aspirin-acetaminophen-caffeine (EXCEDRIN MIGRAINE) 250-250-65 MG tablet Take 1 tablet by mouth every 6 (six) hours as needed for headache.    . phentermine (ADIPEX-P) 37.5 MG tablet Take 1/2 to one  tablet a day, in the morning, to suppress appetite    . topiramate (TOPAMAX) 25 MG tablet Take 25 mg by mouth daily.     No current facility-administered medications for this visit.      ALLERGIES: Patient has no known allergies.  Family History  Problem Relation Age of Onset  . Breast cancer Paternal Grandmother 42  . Diabetes Mother   . Heart attack Mother   . Stroke Mother   . Heart attack Father   . Diabetes Father   . CAD Father   . Diabetes Sister   . Breast cancer Paternal Aunt     Social History   Socioeconomic History  . Marital status: Married    Spouse name: Not on file  . Number of children: Not on file  . Years of education: Not on file  . Highest education level: Not on file  Occupational History  . Not on file  Social Needs  . Financial resource strain: Not on file  . Food insecurity:    Worry: Not on file    Inability: Not on file  . Transportation needs:    Medical: Not on file    Non-medical: Not  on file  Tobacco Use  . Smoking status: Never Smoker  . Smokeless tobacco: Never Used  Substance and Sexual Activity  . Alcohol use: No    Frequency: Never  . Drug use: Never  . Sexual activity: Yes    Birth control/protection: Surgical    Comment: Hysterectomy -- ovaries remain  Lifestyle  . Physical activity:    Days per week: Not on file    Minutes per session: Not on file  . Stress: Not on file  Relationships  . Social connections:    Talks on phone: Not on file    Gets together: Not on file    Attends religious service: Not on file    Active member of club or organization: Not on file    Attends meetings of clubs or organizations: Not on file    Relationship status: Not on file  . Intimate partner violence:    Fear of current or ex partner: Not on file    Emotionally abused: Not on file    Physically abused: Not on file    Forced sexual activity: Not on file  Other Topics Concern  . Not on file  Social History Narrative  . Not on  file    Review of Systems  Constitutional: Negative.   HENT: Negative.   Eyes: Negative.   Respiratory: Negative.   Cardiovascular: Negative.   Gastrointestinal: Negative.   Genitourinary: Negative.   Musculoskeletal: Negative.   Skin: Negative.   Neurological: Negative.   Endo/Heme/Allergies: Negative.   Psychiatric/Behavioral: Negative.     PHYSICAL EXAMINATION:    BP (!) 148/72 (BP Location: Right Arm, Patient Position: Sitting, Cuff Size: Normal)   Ht 5' 3.75" (1.619 m)   Wt 175 lb (79.4 kg)   LMP  (LMP Unknown)   BMI 30.27 kg/m     General appearance: alert, cooperative and appears stated age Neck: no adenopathy, supple, symmetrical, trachea midline and thyroid {CHL AMB PHY EX THYROID NORM [phone]::"normal to inspection and palpation"} Breasts: {Exam; breast:13139::"normal appearance, no masses or tenderness"} Abdomen: soft, non-tender; non distended, no masses,  no organomegaly  Pelvic: External genitalia:  no lesions              Urethra:  normal appearing urethra with no masses, tenderness or lesions              Bartholins and Skenes: normal                 Vagina: normal appearing vagina with normal color and discharge, no lesions              Cervix: {CHL AMB PHY EX CERVIX NORM DEFAULT:[phone]::"no lesions"}              Bimanual Exam:  Uterus:  {CHL AMB PHY EX UTERUS NORM [phone]::"normal size, contour, position, consistency, mobility, non-tender"}              Adnexa: {CHL AMB PHY EX ADNEXA NO MASS DEFAULT:[phone]::"no mass, fullness, tenderness"}              Rectovaginal: {yes no:[id number]}.  Confirms.              Anus:  normal sphincter tone, no lesions  Chaperone was present for exam.  ASSESSMENT     PLAN    An After Visit Summary was printed and given to the patient.  *** minutes face to face time of which over 50% was spent in counseling.

## 2018-01-17 NOTE — Progress Notes (Signed)
Call to PCP office Dr. Redmond Pulling. No available appointments until after new year. Scheduled for NP 01/20/18, patient declines. Contacted office and cancelled appointment.   Call to Dr. Collene Mares, GI. Will see her today, patient to go now.  Records faxed.  Pt agreeable.

## 2018-01-17 NOTE — Patient Instructions (Signed)

## 2018-01-17 NOTE — Progress Notes (Signed)
52 y.o. [id number] Married White or Caucasian Not Hispanic or Latino female here as a new patient for an annual exam. Patient complains of having pelvic pain x4 weeks.  Her pain is a constant pressure in her pelvis. Worse when she is sitting. No vaginal bulge. The pain is a 3-4/10, worse if she has more gas. More uncomfortable after a BM. She typically has constipation. She can go a week prior to having a BM, more recently has had diarrhea (in last day she has had 2 loose BM's). About 3 weeks ago she had severe lower abdominal pelvic pain, felt like she needed to have diarrhea, broke out in a sweat, felt better after having the diarrhea.  She has had a hysterectomy, still has her ovaries. She has off and on hot flashes, no night sweats. Some vaginal dryness, hasn't tried a lubricant.  She has some urgency to void. Occasional urge incontinence, occasional stress incontinence.  Typically no dyspareunia, in the last month she has had some deep dyspareunia.     She has had syncope, saw a cardiologist, negative evaluation.  Her BP has been up and down.   No LMP recorded (lmp unknown). Patient has had a hysterectomy.          Sexually active: yes The current method of family planning is status post hysterectomy -- ovaries remain.    Exercising: No.  The patient does not participate in regular exercise at present. Smoker:  no  Health Maintenance: Pap:  About 3 years ago per patient at [facility] -- normal History of abnormal Pap:  no MMG:  05/13/17 BIRADS 1 negative/density c BMD:   n/a Colonoscopy: never TDaP:  2010 Gardasil: n/a   reports that she has never smoked. She has never used smokeless tobacco. She reports that she does not drink alcohol or use drugs. She is mammogram tech. Daughters are 20, 60 and 29. Oldest is a Electrical engineer, middle is getting her PhD in Psychology, youngest at [employer]  Past Medical History:  Diagnosis Date  . Anemia   . Depression   . Gestational diabetes    . HTN (hypertension)   . Migraines   . Obesity   . Pain in female genitalia on intercourse   . Syncope     Past Surgical History:  Procedure Laterality Date  . DILATION AND CURETTAGE OF UTERUS     x2  . LAPAROSCOPIC VAGINAL HYSTERECTOMY      Current Outpatient Medications  Medication Sig Dispense Refill  . acetaminophen (TYLENOL) 325 MG tablet Take 650 mg by mouth every 6 (six) hours as needed for mild pain.     Marland Kitchen aspirin-acetaminophen-caffeine (EXCEDRIN MIGRAINE) 250-250-65 MG tablet Take 1 tablet by mouth every 6 (six) hours as needed for headache.    . phentermine (ADIPEX-P) 37.5 MG tablet Take 1/2 to one tablet a day, in the morning, to suppress appetite    . topiramate (TOPAMAX) 25 MG tablet Take 25 mg by mouth daily.     No current facility-administered medications for this visit.     Family History  Problem Relation Age of Onset  . Breast cancer Paternal Grandmother 47  . Diabetes Mother   . Heart attack Mother   . Stroke Mother   . Heart attack Father   . Diabetes Father   . CAD Father   . Diabetes Sister   . Breast cancer Paternal Aunt     Review of Systems  Constitutional: Negative.   HENT: Negative.  Eyes: Negative.   Respiratory: Negative.   Cardiovascular: Negative.   Gastrointestinal: Negative.   Endocrine: Negative.   Genitourinary: Positive for pelvic pain.  Musculoskeletal: Negative.   Skin: Negative.   Allergic/Immunologic: Negative.   Neurological: Negative.   Hematological: Negative.   Psychiatric/Behavioral: Negative.     Exam:   BP (!) 148/72 (BP Location: Right Arm, Patient Position: Sitting, Cuff Size: Normal)   Pulse 84   Resp 16   Ht 5' 3.75" (1.619 m)   Wt 175 lb (79.4 kg)   LMP  (LMP Unknown)   BMI 30.27 kg/m   Weight change: @WEIGHTCHANGE @ Height:   Height: 5' 3.75" (161.9 cm)  Ht Readings from Last 3 Encounters:  01/17/18 5' 3.75" (1.619 m)  08/09/17 5\' 4"  (1.626 m)  07/05/17 5\' 4"  (1.626 m)    General appearance:  alert, cooperative and appears stated age Head: Normocephalic, without obvious abnormality, atraumatic Neck: no adenopathy, supple, symmetrical, trachea midline and thyroid normal to inspection and palpation Lungs: clear to auscultation bilaterally Cardiovascular: regular rate and rhythm Breasts: normal appearance, no masses or tenderness Abdomen: soft, mildly tender BLQ; non distended,  no masses,  no organomegaly Extremities: extremities normal, atraumatic, no cyanosis or edema Skin: Skin color, texture, turgor normal. No rashes or lesions Lymph nodes: Cervical, supraclavicular, and axillary nodes normal. No abnormal inguinal nodes palpated Neurologic: Grossly normal   Pelvic: External genitalia:  no lesions              Urethra:  normal appearing urethra with no masses, tenderness or lesions              Bartholins and Skenes: normal                 Vagina: normal appearing vagina with normal color and discharge, no lesions              Cervix: absent               Bimanual Exam:  Uterus:  uterus absent              Adnexa: no masses, mild diffuse tenderness on BM exam               Rectovaginal: Confirms               Anus:  normal sphincter tone, no lesions  Chaperone was present for exam.  A:  Well Woman with normal exam  4 week h/o abdominal pelvic pain  Constipation/diarrhea (long history)  H/O hysterectomy, still has her ovaries  H/O gestational diabetes  P:   No pap needed  Mammogram UTD  Colonoscopy overdue, needs referral to GI for pain, bowel issues and colonoscopy  CBC, lipid panel, FSH, TSH, HgbA1C  Discussed breast self exam  Discussed calcium and vit D intake  Urine for ua, c&s  Return for GYN ultrasound    Addendum: reviewed ultrasound with the patient, absent uterus, no adnexal abnormalities. Bowel appears inflamed, concerning for possible diverticulitis. Will have her f/u with her primary MD.   In addition to her annual exam at least an additional 10  minutes was spent evaluating her abdominal/pelvic  pain, over 50% in counseling  CC: Dr Redmond Pulling

## 2018-01-18 ENCOUNTER — Ambulatory Visit
Admission: RE | Admit: 2018-01-18 | Discharge: 2018-01-18 | Disposition: A | Discharge status: Home / Self Care | Payer: BLUE CROSS/BLUE SHIELD | Source: Ambulatory Visit | Attending: Gastroenterology | Admitting: Gastroenterology

## 2018-01-18 ENCOUNTER — Other Ambulatory Visit: Payer: Self-pay | Admitting: Gastroenterology

## 2018-01-18 LAB — HEMOGLOBIN A1C
Est. average glucose Bld gHb Est-mCnc: 117 mg/dL
Hgb A1c MFr Bld: 5.7 % — ABNORMAL HIGH (ref 4.8–5.6)

## 2018-01-18 LAB — CBC
Hematocrit: 41 % (ref 34.0–46.6)
Hemoglobin: 13.7 g/dL (ref 11.1–15.9)
MCH: 29.7 pg (ref 26.6–33.0)
MCHC: 33.4 g/dL (ref 31.5–35.7)
MCV: 89 fL (ref 79–97)
Platelets: 260 10*3/uL (ref 150–450)
RBC: 4.61 x10E6/uL (ref 3.77–5.28)
RDW: 12.6 % (ref 12.3–15.4)
WBC: 5.5 10*3/uL (ref 3.4–10.8)

## 2018-01-18 LAB — FOLLICLE STIMULATING HORMONE: FSH: 95.9 m[IU]/mL

## 2018-01-18 LAB — LIPID PANEL
Chol/HDL Ratio: 2.7 ratio (ref 0.0–4.4)
Cholesterol, Total: 204 mg/dL — ABNORMAL HIGH (ref 100–199)
HDL: 76 mg/dL (ref >39)
LDL Calculated: 110 mg/dL — ABNORMAL HIGH (ref 0–99)
Triglycerides: 91 mg/dL (ref 0–149)
VLDL Cholesterol Cal: 18 mg/dL (ref 5–40)

## 2018-01-18 LAB — URINALYSIS, MICROSCOPIC ONLY
Casts: NONE SEEN /lpf
RBC, UA: NONE SEEN /hpf (ref 0–2)

## 2018-01-18 LAB — TSH: TSH: 1.49 u[IU]/mL (ref 0.450–4.500)

## 2018-01-18 MED ORDER — IOPAMIDOL (ISOVUE-300) INJECTION 61%
100.0000 mL | Freq: Once | INTRAVENOUS | Status: AC | PRN
  Administered 2018-01-18: 100 mL via INTRAVENOUS

## 2018-01-19 ENCOUNTER — Telehealth: Payer: Self-pay | Admitting: *Deleted

## 2018-01-19 DIAGNOSIS — R7303 Prediabetes: Secondary | ICD-10-CM

## 2018-01-19 DIAGNOSIS — Z8632 Personal history of gestational diabetes: Secondary | ICD-10-CM

## 2018-01-19 LAB — URINE CULTURE

## 2018-01-19 LAB — SPECIMEN STATUS REPORT

## 2018-01-19 NOTE — Telephone Encounter (Signed)
Notes recorded by Burnice Logan, RN on 01/19/2018 at 2:06 PM EST Left message to call Sharee Pimple, RN at [facility].   Notes recorded by Salvadore Dom, MD on 01/18/2018 at 1:23 PM EST Please let the patient know that she has pre-diabetes and set her up at the pre-diabetes clinic. Her [place] is in a menopausal range. The rest of her blood work is normal.  She saw Dr Collene Mares yesterday for abdominal pain and what appeared to be inflamed bowel on Ultrasound. I see that she had a negative CT, please see how she is feeling and what Dr Collene Mares recommended.

## 2018-01-19 NOTE — Telephone Encounter (Signed)
Call reviewed with Dr. Talbert Nan. Has not received call from Dr. Collene Mares. Recommends f/u with PCP if abdominal/pelvic pain & diarrhea continues, negative GYN exam.   Call returned to patient, left detailed message, ok per dpr. Advised as seen above per Dr. Talbert Nan, return call to office if assistance is needed with establishing care with PCP or if any additional questions/concerns.    Routing to provider for final review. Patient is agreeable to disposition. Will close encounter.

## 2018-01-19 NOTE — Telephone Encounter (Signed)
Spoke with patient, advised as seen below per Dr. Talbert Nan.   1. Patient accepts referral to [facility], order placed, aware will be called with results.   2. Patient reports she is feeling a little better today. Pain was increased after PUS and CT. Reports diarrhea has not resolved, 2 BMS today.  Denies fever/chills. Seen by Dr. Uvaldo Rising advised testing normal, started on Cipro, was advised to f/u with GYN. Colonoscopy scheduled for 03/2018, recommended waiting due to possibility of diverticulitis. Patient states Dr. Collene Mares was going to f/u with Dr. Talbert Nan. Advised I will update Dr. Talbert Nan and return call, patient agreeable. Request detailed message if no answer.   Routing to Dr. Talbert Nan.

## 2018-02-09 LAB — BASIC METABOLIC PANEL
BUN/Creatinine Ratio: 19 (ref 9–23)
BUN: 15 mg/dL (ref 6–24)
CO2: 15 mmol/L — ABNORMAL LOW (ref 20–29)
Calcium: 9.6 mg/dL (ref 8.7–10.2)
Chloride: 105 mmol/L (ref 96–106)
Creatinine, Ser: 0.8 mg/dL (ref 0.57–1.00)
GFR calc Af Amer: 97 mL/min/{1.73_m2} (ref >59)
GFR calc non Af Amer: 84 mL/min/{1.73_m2} (ref >59)
Glucose: 93 mg/dL (ref 65–99)
Potassium: 4.2 mmol/L (ref 3.5–5.2)
Sodium: 140 mmol/L (ref 134–144)

## 2018-02-09 LAB — HEPATIC FUNCTION PANEL
ALT: 19 IU/L (ref 0–32)
AST: 16 IU/L (ref 0–40)
Albumin: 4.6 g/dL (ref 3.5–5.5)
Alkaline Phosphatase: 76 IU/L (ref 39–117)
Bilirubin Total: 0.6 mg/dL (ref 0.0–1.2)
Bilirubin, Direct: 0.16 mg/dL (ref 0.00–0.40)
Total Protein: 7.3 g/dL (ref 6.0–8.5)

## 2018-04-29 ENCOUNTER — Telehealth: Payer: BLUE CROSS/BLUE SHIELD | Admitting: Physician Assistant

## 2018-04-29 DIAGNOSIS — M791 Myalgia, unspecified site: Secondary | ICD-10-CM

## 2018-04-29 NOTE — Progress Notes (Signed)
E-Visit for Corona Virus Screening  Based on your current symptoms, you may very well have the virus, however your symptoms are mild. Currently, not all patients are being tested. If the symptoms are mild and there is not a known exposure, performing the test is not indicated.  Coronavirus disease 2019 (COVID-19) is a respiratory illness that can spread from person to person. The virus that causes COVID-19 is a new virus that was first identified in the country of [place] but is now found in multiple other countries and has spread to the [place].  Symptoms associated with the virus are mild to severe fever, cough, and shortness of breath. There is currently no vaccine to protect against COVID-19, and there is no specific antiviral treatment for the virus.   To be considered HIGH RISK for Coronavirus (COVID-19), you have to meet the following criteria:  . Traveled to [place], [place], [place], [place] or [place]; or in the [place] to [place], [place], [place], or [place]; and have fever, cough, and shortness of breath within the last 2 weeks of travel OR  . Been in close contact with a person diagnosed with COVID-19 within the last 2 weeks and have fever, cough, and shortness of breath  . IF YOU DO NOT MEET THESE CRITERIA, YOU ARE CONSIDERED LOW RISK FOR COVID-19.   It is vitally important that if you feel that you have an infection such as this virus or any other virus that you stay home and away from places where you may spread it to others.  You should self-quarantine for 14 days if you have symptoms that could potentially be coronavirus and avoid contact with people age 65 and older.    You may take acetaminophen (Tylenol) as needed for fever.   Reduce your risk of any infection by using the same precautions used for avoiding the common cold or flu:  . Wash your hands often with soap and warm water for at least 20 seconds.  If soap and water are not readily available, use an  alcohol-based hand sanitizer with at least 60% alcohol.  . If coughing or sneezing, cover your mouth and nose by coughing or sneezing into the elbow areas of your shirt or coat, into a tissue or into your sleeve (not your hands). . Avoid shaking hands with others and consider head nods or verbal greetings only. . Avoid touching your eyes, nose, or mouth with unwashed hands.  . Avoid close contact with people who are sick. . Avoid places or events with large numbers of people in one location, like concerts or sporting events. . Carefully consider travel plans you have or are making. . If you are planning any travel outside or inside the [place], visit the CDC's Travelers' Health webpage for the latest health notices. . If you have some symptoms but not all symptoms, continue to monitor at home and seek medical attention if your symptoms worsen. . If you are having a medical emergency, call 911.  HOME CARE . Only take medications as instructed by your medical team. . Drink plenty of fluids and get plenty of rest. . A steam or ultrasonic humidifier can help if you have congestion.   GET HELP RIGHT AWAY IF: . You develop worsening fever. . You become short of breath . You cough up blood. . Your symptoms become more severe MAKE SURE YOU   Understand these instructions.  Will watch your condition.  Will get help right away if   you are not doing well or get worse.  Your e-visit answers were reviewed by a board certified advanced clinical practitioner to complete your personal care plan.  Depending on the condition, your plan could have included both over the counter or prescription medications.  If there is a problem please reply once you have received a response from your provider. Your safety is important to us.  If you have drug allergies check your prescription carefully.    You can use MyChart to ask questions about today's visit, request a non-urgent call back, or ask for a work or school  excuse for 24 hours related to this e-Visit. If it has been greater than 24 hours you will need to follow up with your provider, or enter a new e-Visit to address those concerns. You will get an e-mail in the next two days asking about your experience.  I hope that your e-visit has been valuable and will speed your recovery. Thank you for using e-visits.   A total of 5-10 minutes was spent evaluating this patients questionnaire and formulating a plan of care.     

## 2018-05-31 ENCOUNTER — Ambulatory Visit
Admission: RE | Admit: 2018-05-31 | Discharge: 2018-05-31 | Disposition: A | Discharge status: Home / Self Care | Payer: BLUE CROSS/BLUE SHIELD | Source: Ambulatory Visit | Attending: Family Medicine | Admitting: Family Medicine

## 2018-05-31 ENCOUNTER — Other Ambulatory Visit: Payer: Self-pay | Admitting: Family Medicine

## 2018-05-31 DIAGNOSIS — R05 Cough: Secondary | ICD-10-CM

## 2018-05-31 DIAGNOSIS — R059 Cough, unspecified: Secondary | ICD-10-CM

## 2018-07-28 ENCOUNTER — Other Ambulatory Visit: Payer: Self-pay

## 2018-07-28 ENCOUNTER — Ambulatory Visit
Admission: RE | Admit: 2018-07-28 | Discharge: 2018-07-28 | Disposition: A | Discharge status: Home / Self Care | Payer: BC Managed Care – PPO | Source: Ambulatory Visit | Attending: Family Medicine | Admitting: Family Medicine

## 2018-07-28 ENCOUNTER — Other Ambulatory Visit: Payer: Self-pay | Admitting: Family Medicine

## 2018-07-28 DIAGNOSIS — Z1231 Encounter for screening mammogram for malignant neoplasm of breast: Secondary | ICD-10-CM

## 2018-09-12 ENCOUNTER — Other Ambulatory Visit: Payer: Self-pay | Admitting: Family Medicine

## 2018-09-12 DIAGNOSIS — R911 Solitary pulmonary nodule: Secondary | ICD-10-CM

## 2018-09-15 ENCOUNTER — Other Ambulatory Visit: Payer: Self-pay | Admitting: Family Medicine

## 2018-09-18 ENCOUNTER — Other Ambulatory Visit: Payer: Self-pay | Admitting: Family Medicine

## 2018-09-19 ENCOUNTER — Other Ambulatory Visit: Payer: Self-pay | Admitting: Family Medicine

## 2018-09-20 ENCOUNTER — Other Ambulatory Visit: Payer: BC Managed Care – PPO

## 2019-07-20 ENCOUNTER — Other Ambulatory Visit: Payer: Self-pay | Admitting: Family Medicine

## 2019-07-20 ENCOUNTER — Ambulatory Visit
Admission: RE | Admit: 2019-07-20 | Discharge: 2019-07-20 | Disposition: A | Discharge status: Home / Self Care | Payer: BC Managed Care – PPO | Source: Ambulatory Visit | Attending: Family Medicine | Admitting: Family Medicine

## 2019-07-20 DIAGNOSIS — N644 Mastodynia: Secondary | ICD-10-CM | POA: Diagnosis present

## 2019-07-20 DIAGNOSIS — Z1231 Encounter for screening mammogram for malignant neoplasm of breast: Secondary | ICD-10-CM | POA: Diagnosis present

## 2019-07-23 ENCOUNTER — Other Ambulatory Visit: Payer: BC Managed Care – PPO

## 2019-07-23 ENCOUNTER — Ambulatory Visit: Payer: BC Managed Care – PPO

## 2019-07-27 ENCOUNTER — Other Ambulatory Visit: Payer: Self-pay

## 2019-07-27 ENCOUNTER — Emergency Department (HOSPITAL_COMMUNITY): Payer: BC Managed Care – PPO

## 2019-07-27 ENCOUNTER — Encounter (HOSPITAL_COMMUNITY): Payer: Self-pay

## 2019-07-27 ENCOUNTER — Emergency Department (HOSPITAL_COMMUNITY)
Admission: EM | Admit: 2019-07-27 | Discharge: 2019-07-27 | Disposition: A | Discharge status: Home / Self Care | Payer: BC Managed Care – PPO | Attending: Emergency Medicine | Admitting: Emergency Medicine

## 2019-07-27 DIAGNOSIS — R55 Syncope and collapse: Secondary | ICD-10-CM | POA: Diagnosis present

## 2019-07-27 DIAGNOSIS — Z79899 Other long term (current) drug therapy: Secondary | ICD-10-CM | POA: Diagnosis not present

## 2019-07-27 DIAGNOSIS — R739 Hyperglycemia, unspecified: Secondary | ICD-10-CM | POA: Diagnosis not present

## 2019-07-27 DIAGNOSIS — I10 Essential (primary) hypertension: Secondary | ICD-10-CM

## 2019-07-27 LAB — CBC WITH DIFFERENTIAL/PLATELET
Abs Immature Granulocytes: 0.03 10*3/uL (ref 0.00–0.07)
Basophils Absolute: 0 10*3/uL (ref 0.0–0.1)
Basophils Relative: 0 %
Eosinophils Absolute: 0 10*3/uL (ref 0.0–0.5)
Eosinophils Relative: 0 %
HCT: 42.4 % (ref 36.0–46.0)
Hemoglobin: 13.7 g/dL (ref 12.0–15.0)
Immature Granulocytes: 0 %
Lymphocytes Relative: 11 %
Lymphs Abs: 1 10*3/uL (ref 0.7–4.0)
MCH: 29.8 pg (ref 26.0–34.0)
MCHC: 32.3 g/dL (ref 30.0–36.0)
MCV: 92.2 fL (ref 80.0–100.0)
Monocytes Absolute: 0.7 10*3/uL (ref 0.1–1.0)
Monocytes Relative: 8 %
Neutro Abs: 7.2 10*3/uL (ref 1.7–7.7)
Neutrophils Relative %: 81 %
Platelets: 252 10*3/uL (ref 150–400)
RBC: 4.6 MIL/uL (ref 3.87–5.11)
RDW: 11.9 % (ref 11.5–15.5)
WBC: 9 10*3/uL (ref 4.0–10.5)
nRBC: 0 % (ref 0.0–0.2)

## 2019-07-27 LAB — CBG MONITORING, ED: Glucose-Capillary: 122 mg/dL — ABNORMAL HIGH (ref 70–99)

## 2019-07-27 LAB — BASIC METABOLIC PANEL
Anion gap: 10 (ref 5–15)
BUN: 9 mg/dL (ref 6–20)
CO2: 25 mmol/L (ref 22–32)
Calcium: 8.9 mg/dL (ref 8.9–10.3)
Chloride: 101 mmol/L (ref 98–111)
Creatinine, Ser: 0.68 mg/dL (ref 0.44–1.00)
GFR calc Af Amer: >60 mL/min (ref >60)
GFR calc non Af Amer: >60 mL/min (ref >60)
Glucose, Bld: 135 mg/dL — ABNORMAL HIGH (ref 70–99)
Potassium: 4.1 mmol/L (ref 3.5–5.1)
Sodium: 136 mmol/L (ref 135–145)

## 2019-07-27 MED ORDER — HYDROCODONE-ACETAMINOPHEN 5-325 MG PO TABS
1.0000 | ORAL_TABLET | Freq: Once | ORAL | Status: AC
  Administered 2019-07-27: 1 via ORAL
  Filled 2019-07-27: qty 1

## 2019-07-27 MED ORDER — SODIUM CHLORIDE 0.9 % IV BOLUS
1000.0000 mL | Freq: Once | INTRAVENOUS | Status: AC
  Administered 2019-07-27: 1000 mL via INTRAVENOUS

## 2019-07-27 NOTE — ED Provider Notes (Signed)
[facility] EMERGENCY DEPARTMENT Provider Note   CSN: [id number] Arrival date & time: 07/27/19  7062     History Chief Complaint  Patient presents with  . Loss of Consciousness    Cheryl Mayer is a 54 y.o. female with history of migraines, HTN, who presents with syncopal episode.  Patient states that she had foot surgery yesterday for bunion removal by Dr. Novella Olive.  She was prescribed hydrocodone.  She took it she yesterday afternoon.  And did not have any problems.  Overnight she had to get up to go to the bathroom a couple of times.  Each time she got lightheaded, diaphoretic and nauseous.  Early this morning she got up to go the bathroom again and was sitting on the toilet and had a syncopal episode.  She fell forward and believes she hit her face on the tub.  She went for a follow-up visit to the orthopedic clinic this morning because she had been bleeding through the bandage and she wanted to have it checked.  While she was sitting in the waiting room she became lightheaded again and passed out for less than 30 seconds per her daughter Cheryl Mayer who is at bedside.  She also states that she had some twitching and right-sided facial droop.  Patient became conscious again and is back to baseline. There was no confusion, tongue biting, incontinence. She states that she feels generally weak but otherwise denies severe headache, chest pain, shortness of breath, abdominal pain.  She is having some soreness around the right eye and her neck.  She is also having some throbbing of her left foot due to postop pain.  She was admitted for syncope in May 2019 and had an a unremarkable work-up.  Syncopal episode was thought to be iatrogenic due to starting a new blood pressure medication. HPI     Past Medical History:  Diagnosis Date  . Anemia   . Depression   . Gestational diabetes   . HTN (hypertension)   . Migraines   . Obesity   . Pain in female genitalia on intercourse     . Syncope     Patient Active Problem List   Diagnosis Date Noted  . Chest discomfort 08/09/2017  . Bruit of left carotid artery 06/28/2017  . Essential hypertension 06/22/2017  . Leukocytosis 06/22/2017  . AKI (acute kidney injury) ([place]) 06/22/2017  . Dyspnea 06/22/2017  . Syncope 06/21/2017  . Benign neoplasm of skin of face 03/07/2017  . Left shoulder pain 08/11/2016  . Benign skin lesion 05/24/2015  . Rosacea 05/24/2015  . Attention deficit disorder of adult 08/02/2014  . Depression 08/02/2014  . Dyshidrotic eczema 08/02/2014  . Migraines 08/02/2014  . Overweight (BMI 25.0-29.9) 08/02/2014  . Symptomatic menopausal or female climacteric states 08/02/2014    Past Surgical History:  Procedure Laterality Date  . DILATION AND CURETTAGE OF UTERUS     x2  . LAPAROSCOPIC VAGINAL HYSTERECTOMY       OB History    Gravida  8   Para  6   Term  3   Preterm  0   AB  2   Living  3     SAB  2   TAB  0   Ectopic  0   Multiple  0   Live Births  3           Family History  Problem Relation Age of Onset  . Breast cancer Paternal Grandmother 61  .  Diabetes Mother   . Heart attack Mother   . Stroke Mother   . Heart attack Father   . Diabetes Father   . CAD Father   . Diabetes Sister   . Breast cancer Paternal Aunt     Social History   Tobacco Use  . Smoking status: Never Smoker  . Smokeless tobacco: Never Used  Vaping Use  . Vaping Use: Never used  Substance Use Topics  . Alcohol use: No  . Drug use: Never    Home Medications Prior to Admission medications   Medication Sig Start Date End Date Taking? Authorizing Provider  acetaminophen (TYLENOL) 325 MG tablet Take 650 mg by mouth every 6 (six) hours as needed for mild pain.     [provider]  aspirin-acetaminophen-caffeine (EXCEDRIN MIGRAINE) [phone] MG tablet Take 1 tablet by mouth every 6 (six) hours as needed for headache.    [provider]  phentermine (ADIPEX-P)  37.5 MG tablet Take 1/2 to one tablet a day, in the morning, to suppress appetite 10/13/17   [provider]  topiramate (TOPAMAX) 25 MG tablet Take 25 mg by mouth daily. 06/12/17   [provider]    Allergies    Patient has no known allergies.  Review of Systems   Review of Systems  Constitutional: Positive for fatigue. Negative for chills and fever.  Respiratory: Negative for shortness of breath.   Cardiovascular: Negative for chest pain.  Gastrointestinal: Negative for abdominal pain.  Musculoskeletal: Positive for arthralgias.  Neurological: Positive for syncope, weakness and light-headedness. Negative for headaches.    Physical Exam Updated Vital Signs BP (!) 144/78   Pulse 65   Temp 97.7 F (36.5 C) (Oral)   Resp 15   Ht 5\' 4"  (1.626 m)   Wt 81.6 kg   LMP  (LMP Unknown)   SpO2 100%   BMI 30.90 kg/m   Physical Exam Vitals and nursing note reviewed.  Constitutional:      General: She is not in acute distress.    Appearance: Normal appearance. She is well-developed. She is not ill-appearing.  HENT:     Head: Normocephalic.  Eyes:     General: No scleral icterus.       Right eye: No discharge.        Left eye: No discharge.     Conjunctiva/sclera: Conjunctivae normal.     Pupils: Pupils are equal, round, and reactive to light.     Comments: Periorbital bruising around the R eye  Cardiovascular:     Rate and Rhythm: Normal rate and regular rhythm.  Pulmonary:     Effort: Pulmonary effort is normal. No respiratory distress.     Breath sounds: Normal breath sounds.  Abdominal:     General: There is no distension.     Palpations: Abdomen is soft.     Tenderness: There is no abdominal tenderness.  Musculoskeletal:     Cervical back: Normal range of motion.     Right lower leg: No edema.     Left lower leg: No edema.     Comments: Left foot wrapped in dressing and post-op shoe  Skin:    General: Skin is warm and dry.  Neurological:     Mental  Status: She is alert and oriented to person, place, and time.  Psychiatric:        Behavior: Behavior normal.     ED Results / Procedures / Treatments   Labs (all labs ordered are listed, but  only abnormal results are displayed) Labs Reviewed  BASIC METABOLIC PANEL - Abnormal; Notable for the following components:      Result Value   Glucose, Bld 135 (*)    All other components within normal limits  CBG MONITORING, ED - Abnormal; Notable for the following components:   Glucose-Capillary 122 (*)    All other components within normal limits  CBC WITH DIFFERENTIAL/PLATELET    EKG None  Radiology No results found.  Procedures Procedures (including critical care time)  Medications Ordered in ED Medications  sodium chloride 0.9 % bolus 1,000 mL (0 mLs Intravenous Stopped 07/27/19 1308)  HYDROcodone-acetaminophen (NORCO/VICODIN) 5-325 MG per tablet 1 tablet (1 tablet Oral Given 07/27/19 1307)    ED Course  I have reviewed the triage vital signs and the nursing notes.  Pertinent labs & imaging results that were available during my care of the patient were reviewed by me and considered in my medical decision making (see chart for details).  Clinical Course as of Jul 27 1539  Fri Jul 27, 2019  1511 Stable. Syncope on toilet. Hit head on tub. Had bunion surgery yesterday. Went back today for bleeding at surgical site. Then had a second syncopal episode. Concern for Seizure, but <30 seconds and no postictal period.    [AL]    Clinical Course User Index [AL] Delma Post, MD   55 year old female presents with several syncopal episodes and possible seizure-like activity.  She is hypertensive but otherwise vital signs are normal.  She is periorbital bruising around the right eye on exam.  She is calm and cooperative.  Heart is regular rate and rhythm.  Lungs are clear to auscultation.  Her is wrapped and she has no lower extremity edema.  EKG is normal sinus rhythm.  Will obtain labs  and CT head due to questionable seizure-like activity of the sounds more like another syncopal episode as she has had no postictal period, tongue biting, incontinence.   Labs are all normal.  Patient was able to ambulate in the ED and has been p.o. challenged.  At shift change CT head is still pending.  She has had a previous syncope work-up which was very reassuring.  She feels better after fluids.  She did have a drop in her blood pressure with orthostatics however was not very symptomatic.  Care signed out to Dr. Laverle Patter at shift change. If CT is negative, would d/c.  MDM Rules/Calculators/A&P                           Final Clinical Impression(s) / ED Diagnoses Final diagnoses:  Syncope, unspecified syncope type    Rx / DC Orders ED Discharge Orders    None       Recardo Evangelist, PA-C 07/27/19 1545    Quintella Reichert, MD 07/30/19 [id number]

## 2019-07-27 NOTE — ED Provider Notes (Signed)
Medical Decision Making: Care of patient assumed from outgoing provider at  12:27 PM.  Agree with history, physical exam and plan.  See their note for further details.  Briefly, 54 y.o. female with PMH/PSH as below.  Chief Complaint  Patient presents with  . Loss of Consciousness    Past Medical History:  Diagnosis Date  . Anemia   . Depression   . Gestational diabetes   . HTN (hypertension)   . Migraines   . Obesity   . Pain in female genitalia on intercourse   . Syncope    Past Surgical History:  Procedure Laterality Date  . DILATION AND CURETTAGE OF UTERUS     x2  . LAPAROSCOPIC VAGINAL HYSTERECTOMY       Results for orders placed or performed during the hospital encounter of 15/17/61  Basic metabolic panel  Result Value Ref Range   Sodium 136 135 - 145 mmol/L   Potassium 4.1 3.5 - 5.1 mmol/L   Chloride 101 98 - 111 mmol/L   CO2 25 22 - 32 mmol/L   Glucose, Bld 135 (H) 70 - 99 mg/dL   BUN 9 6 - 20 mg/dL   Creatinine, Ser 0.68 0.44 - 1.00 mg/dL   Calcium 8.9 8.9 - 10.3 mg/dL   GFR calc non Af Amer >60 >60 mL/min   GFR calc Af Amer >60 >60 mL/min   Anion gap 10 5 - 15  CBC WITH DIFFERENTIAL  Result Value Ref Range   WBC 9.0 4.0 - 10.5 K/uL   RBC 4.60 3.87 - 5.11 MIL/uL   Hemoglobin 13.7 12.0 - 15.0 g/dL   HCT 42.4 36 - 46 %   MCV 92.2 80.0 - 100.0 fL   MCH 29.8 26.0 - 34.0 pg   MCHC 32.3 30.0 - 36.0 g/dL   RDW 11.9 11.5 - 15.5 %   Platelets 252 150 - 400 K/uL   nRBC 0.0 0.0 - 0.2 %   Neutrophils Relative % 81 %   Neutro Abs 7.2 1.7 - 7.7 K/uL   Lymphocytes Relative 11 %   Lymphs Abs 1.0 0.7 - 4.0 K/uL   Monocytes Relative 8 %   Monocytes Absolute 0.7 0 - 1 K/uL   Eosinophils Relative 0 %   Eosinophils Absolute 0.0 0 - 0 K/uL   Basophils Relative 0 %   Basophils Absolute 0.0 0 - 0 K/uL   Immature Granulocytes 0 %   Abs Immature Granulocytes 0.03 0.00 - 0.07 K/uL  CBG monitoring, ED  Result Value Ref Range   Glucose-Capillary 122 (H) 70 - 99 mg/dL    CT Head Wo Contrast  Result Date: 07/27/2019 CLINICAL DATA:  Syncope EXAM: CT HEAD WITHOUT CONTRAST TECHNIQUE: Contiguous axial images were obtained from the base of the skull through the vertex without intravenous contrast. Sagittal and coronal MPR images reconstructed from axial data set. COMPARISON:  None FINDINGS: Brain: Normal ventricular morphology. No midline shift or mass effect. Normal appearance of brain parenchyma. No intracranial hemorrhage, mass lesion, evidence of acute infarction, or extra-axial fluid collection. Vascular: No hyperdense vessels Skull: Intact Sinuses/Orbits: Clear Other: N/A IMPRESSION: Normal exam. Electronically Signed   By: Lavonia Dana M.D.   On: 07/27/2019 16:23   US BREAST LTD UNI RIGHT INC AXILLA  Result Date: 07/20/2019 CLINICAL DATA:  54 year old female presenting for evaluation of focal right breast pain. EXAM: DIGITAL DIAGNOSTIC BILATERAL MAMMOGRAM WITH CAD AND TOMO ULTRASOUND RIGHT BREAST COMPARISON:  Previous exam(s). ACR Breast Density Category c: The breast  tissue is heterogeneously dense, which may obscure small masses. FINDINGS: On the routine images of the upper-outer quadrant of the right breast, there is an asymmetry with possible associated distortion. Spot compression tomosynthesis images through this region demonstrate resolution of this asymmetry. Subsequently, a full paddle true lateral and a rolled medial CC tomosynthesis projections were performed demonstrating no persistent findings of distortion. No other suspicious calcifications, masses or areas of distortion are seen in the bilateral breasts. Mammographic images were processed with CAD. On physical exam, no discrete palpable masses are identified in the lateral and upper outer right breast. Targeted ultrasound is performed, showing normal fibroglandular tissue in the upper-outer quadrant of the right breast. There are multiple scattered benign-appearing cysts in the upper-outer quadrant, one of  the largest at 11 o'clock, 6 cm from the nipple measuring 9 x 4 x 9 mm. IMPRESSION: 1. There are no persistent mammographic or targeted sonographic abnormalities in the lateral right breast. Scattered benign fibrocystic changes noted. 2.  No mammographic evidence of malignancy in the bilateral breasts. RECOMMENDATION: 1. Clinical follow-up recommended for the tender area of concern in the right breast. Any further workup should be based on clinical grounds. 2.  Screening mammogram in one year.(Code:SM-B-01Y) I have discussed the findings and recommendations with the patient. If applicable, a reminder letter will be sent to the patient regarding the next appointment. BI-RADS CATEGORY  2: Benign. Electronically Signed   By: Ammie Ferrier M.D.   On: 07/20/2019 13:26   MM DIAG BREAST TOMO BILATERAL  Result Date: 07/20/2019 CLINICAL DATA:  54 year old female presenting for evaluation of focal right breast pain. EXAM: DIGITAL DIAGNOSTIC BILATERAL MAMMOGRAM WITH CAD AND TOMO ULTRASOUND RIGHT BREAST COMPARISON:  Previous exam(s). ACR Breast Density Category c: The breast tissue is heterogeneously dense, which may obscure small masses. FINDINGS: On the routine images of the upper-outer quadrant of the right breast, there is an asymmetry with possible associated distortion. Spot compression tomosynthesis images through this region demonstrate resolution of this asymmetry. Subsequently, a full paddle true lateral and a rolled medial CC tomosynthesis projections were performed demonstrating no persistent findings of distortion. No other suspicious calcifications, masses or areas of distortion are seen in the bilateral breasts. Mammographic images were processed with CAD. On physical exam, no discrete palpable masses are identified in the lateral and upper outer right breast. Targeted ultrasound is performed, showing normal fibroglandular tissue in the upper-outer quadrant of the right breast. There are multiple  scattered benign-appearing cysts in the upper-outer quadrant, one of the largest at 11 o'clock, 6 cm from the nipple measuring 9 x 4 x 9 mm. IMPRESSION: 1. There are no persistent mammographic or targeted sonographic abnormalities in the lateral right breast. Scattered benign fibrocystic changes noted. 2.  No mammographic evidence of malignancy in the bilateral breasts. RECOMMENDATION: 1. Clinical follow-up recommended for the tender area of concern in the right breast. Any further workup should be based on clinical grounds. 2.  Screening mammogram in one year.(Code:SM-B-01Y) I have discussed the findings and recommendations with the patient. If applicable, a reminder letter will be sent to the patient regarding the next appointment. BI-RADS CATEGORY  2: Benign. Electronically Signed   By: Ammie Ferrier M.D.   On: 07/20/2019 13:26    Medications  sodium chloride 0.9 % bolus 1,000 mL (0 mLs Intravenous Stopped 07/27/19 1308)  HYDROcodone-acetaminophen (NORCO/VICODIN) 5-325 MG per tablet 1 tablet (1 tablet Oral Given 07/27/19 1307)  HYDROcodone-acetaminophen (NORCO/VICODIN) 5-325 MG per tablet 1 tablet (1 tablet Oral  Given 07/27/19 1629)    Clinical Course as of Jul 27 1225  Fri Jul 27, 2019  1511 Stable. Syncope on toilet. Hit head on tub. Had bunion surgery yesterday. Went back today for bleeding at surgical site. Then had a second syncopal episode. Concern for Seizure, but <30 seconds and no postictal period.    [AL]    Clinical Course User Index [AL] Delma Post, MD    Interval MDM: CT head unremarkable.  Patient reevaluated, updated on CT results, lab results.  No acute findings.  Hypertension hyperglycemia discussed with patient.  This may be due to recent surgery and stress response as well as pain.  Patient instructed to follow-up with her PCP after acute process has resolved.  Patient and daughter stated understanding.  Diagnosis, treatment and plan of care was discussed and agreed upon  with patient.  Patient comfortable with discharge at this time.  Key discharge instructions: You presented to the ED after several episodes of syncope.  Lab work assessing anemia, kidney function was all within normal limits.  You have mild high blood sugar on your lab work, you to follow-up with her primary care physician for this.  Additionally have had hypertension while in the ED, this is likely chronic and should follow up with your PCP for further management evaluation.  Your head CT did not show any masses or bleeds or any significant abnormal findings.      Delma Post, MD 07/28/19 1228    Davonna Belling, MD 07/28/19 2014

## 2019-07-27 NOTE — ED Triage Notes (Signed)
Pt presents from ortho clinic for syncope. Had returned to ortho for 1 day f/u s/p bunion removal, syncope in waiting room for approximately 20sec, nausea and dizziness felt prior. EMS orthostatics SBP 160 to 150 from lying to sitting, HR 65. NSR with PVCs on monitor. Pt also experienced syncope this AM and hit head on bathroom floor, redness to area around R eye noted.   Similar episodes of syncope "a couiple" of years ago, no etiology found by cardiology or workup otherwise at that time.  BP 165/87 at exam in triage

## 2019-07-27 NOTE — Discharge Instructions (Addendum)
You presented to the ED after several episodes of syncope.  Lab work assessing anemia, kidney function was all within normal limits.  You have mild high blood sugar on your lab work, you to follow-up with her primary care physician for this.  Additionally have had hypertension while in the ED, this is likely chronic and should follow up with your PCP for further management evaluation.  Your head CT did not show any masses or bleeds or any significant abnormal findings.

## 2020-04-07 ENCOUNTER — Other Ambulatory Visit (HOSPITAL_COMMUNITY): Payer: Self-pay | Admitting: Family Medicine

## 2020-04-07 DIAGNOSIS — R599 Enlarged lymph nodes, unspecified: Secondary | ICD-10-CM

## 2020-04-07 DIAGNOSIS — M542 Cervicalgia: Secondary | ICD-10-CM

## 2020-04-14 ENCOUNTER — Other Ambulatory Visit: Payer: Self-pay

## 2020-04-14 ENCOUNTER — Ambulatory Visit (INDEPENDENT_AMBULATORY_CARE_PROVIDER_SITE_OTHER): Payer: BC Managed Care – PPO | Admitting: Pulmonary Disease

## 2020-04-14 ENCOUNTER — Encounter: Payer: Self-pay | Admitting: Pulmonary Disease

## 2020-04-14 VITALS — BP 146/92 | HR 89 | Temp 98.0°F | Ht 64.0 in | Wt 183.0 lb

## 2020-04-14 DIAGNOSIS — Z87898 Personal history of other specified conditions: Secondary | ICD-10-CM

## 2020-04-14 NOTE — Addendum Note (Signed)
Addended by: Birder Robson on: 04/14/2020 04:32 PM   Modules accepted: Orders

## 2020-04-14 NOTE — Patient Instructions (Signed)
Moderate probability of significant obstructive sleep apnea  We will schedule you for home sleep study Treatment options as we discussed  Tentative follow-up following initiation of treatment  I will see you back in 3 to 4 months  Call with significant concerns Sleep Apnea Sleep apnea affects breathing during sleep. It causes breathing to stop for a short time or to become shallow. It can also increase the risk of:  Heart attack.  Stroke.  Being very overweight (obese).  Diabetes.  Heart failure.  Irregular heartbeat. The goal of treatment is to help you breathe normally again. What are the causes? There are three kinds of sleep apnea:  Obstructive sleep apnea. This is caused by a blocked or collapsed airway.  Central sleep apnea. This happens when the brain does not send the right signals to the muscles that control breathing.  Mixed sleep apnea. This is a combination of obstructive and central sleep apnea. The most common cause of this condition is a collapsed or blocked airway. This can happen if:  Your throat muscles are too relaxed.  Your tongue and tonsils are too large.  You are overweight.  Your airway is too small.   What increases the risk?  Being overweight.  Smoking.  Having a small airway.  Being older.  Being female.  Drinking alcohol.  Taking medicines to calm yourself (sedatives or tranquilizers).  Having family members with the condition. What are the signs or symptoms?  Trouble staying asleep.  Being sleepy or tired during the day.  Getting angry a lot.  Loud snoring.  Headaches in the morning.  Not being able to focus your mind (concentrate).  Forgetting things.  Less interest in sex.  Mood swings.  Personality changes.  Feelings of sadness (depression).  Waking up a lot during the night to pee (urinate).  Dry mouth.  Sore throat. How is this diagnosed?  Your medical history.  A physical exam.  A test that  is done when you are sleeping (sleep study). The test is most often done in a sleep lab but may also be done at home. How is this treated?  Sleeping on your side.  Using a medicine to get rid of mucus in your nose (decongestant).  Avoiding the use of alcohol, medicines to help you relax, or certain pain medicines (narcotics).  Losing weight, if needed.  Changing your diet.  Not smoking.  Using a machine to open your airway while you sleep, such as: ? An oral appliance. This is a mouthpiece that shifts your lower jaw forward. ? A CPAP device. This device blows air through a mask when you breathe out (exhale). ? An EPAP device. This has valves that you put in each nostril. ? A BPAP device. This device blows air through a mask when you breathe in (inhale) and breathe out.  Having surgery if other treatments do not work. It is important to get treatment for sleep apnea. Without treatment, it can lead to:  High blood pressure.  Coronary artery disease.  In men, not being able to have an erection (impotence).  Reduced thinking ability.   Follow these instructions at home: Lifestyle  Make changes that your doctor recommends.  Eat a healthy diet.  Lose weight if needed.  Avoid alcohol, medicines to help you relax, and some pain medicines.  Do not use any products that contain nicotine or tobacco, such as cigarettes, e-cigarettes, and chewing tobacco. If you need help quitting, ask your doctor. General instructions  Take  over-the-counter and prescription medicines only as told by your doctor.  If you were given a machine to use while you sleep, use it only as told by your doctor.  If you are having surgery, make sure to tell your doctor you have sleep apnea. You may need to bring your device with you.  Keep all follow-up visits as told by your doctor. This is important. Contact a doctor if:  The machine that you were given to use during sleep bothers you or does not seem  to be working.  You do not get better.  You get worse. Get help right away if:  Your chest hurts.  You have trouble breathing in enough air.  You have an uncomfortable feeling in your back, arms, or stomach.  You have trouble talking.  One side of your body feels weak.  A part of your face is hanging down. These symptoms may be an emergency. Do not wait to see if the symptoms will go away. Get medical help right away. Call your local emergency services (911 in the [place] Do not drive yourself to the hospital. Summary  This condition affects breathing during sleep.  The most common cause is a collapsed or blocked airway.  The goal of treatment is to help you breathe normally while you sleep. This information is not intended to replace advice given to you by your health care provider. Make sure you discuss any questions you have with your health care provider. Document Revised: 11/04/2017 Document Reviewed: 09/13/2017 Elsevier Patient Education  [place].

## 2020-04-14 NOTE — Progress Notes (Signed)
Cheryl Mayer    [id number]    1965-06-26  Primary Care Physician:Wilson, Jama Flavors, MD  Referring Physician: Percell Belt, DO [address] [place] [address]  [place] [address]  Chief complaint:   Patient being seen for snoring, uncontrolled hypertension  HPI:  Uncontrolled hypertension  History of snoring, no witnessed apneas  Usually goes to bed between 10 and 11 PM Takes about 10 to 20 minutes to fall asleep About 3-4 awakenings Final wake up time about 5:30 AM  Weight is up about 20 to 30 pounds  Blood pressure has been more difficult to control recently and she is woken up from sleep with headaches  Admits to dryness of her mouth Admits to headaches Memory is fine Little problem with home sleep focus recently  Dad did snore  Sleepy and tired during the day but never takes naps  Outpatient Encounter Medications as of 04/14/2020  Medication Sig  . acetaminophen (TYLENOL) 325 MG tablet Take 650 mg by mouth every 6 (six) hours as needed for mild pain.   Marland Kitchen amLODipine (NORVASC) 5 MG tablet Take by mouth.  Marland Kitchen aspirin-acetaminophen-caffeine (EXCEDRIN MIGRAINE) 250-250-65 MG tablet Take 1 tablet by mouth every 6 (six) hours as needed for headache.  . cyanocobalamin (,VITAMIN B-12,) 1000 MCG/ML injection Inject 1,000 mcg into the muscle every 30 (thirty) days.  . NORCO 5-325 MG tablet Take 1-2 tablets by mouth every 6 (six) hours as needed. (Patient not taking: Reported on 04/14/2020)  . phentermine (ADIPEX-P) 37.5 MG tablet Take 18.75-37.5 mg by mouth See admin instructions. 1/2 to one tab daily (Patient not taking: Reported on 04/14/2020)  . topiramate (TOPAMAX) 25 MG tablet Take 25 mg by mouth daily. (Patient not taking: Reported on 04/14/2020)   No facility-administered encounter medications on file as of 04/14/2020.    Allergies as of 04/14/2020  . (No Known Allergies)    Past Medical History:  Diagnosis Date  . Anemia   . Depression   . Gestational  diabetes   . HTN (hypertension)   . Migraines   . Obesity   . Pain in female genitalia on intercourse   . Syncope     Past Surgical History:  Procedure Laterality Date  . DILATION AND CURETTAGE OF UTERUS     x2  . LAPAROSCOPIC VAGINAL HYSTERECTOMY      Family History  Problem Relation Age of Onset  . Breast cancer Paternal Grandmother 17  . Diabetes Mother   . Heart attack Mother   . Stroke Mother   . Heart attack Father   . Diabetes Father   . CAD Father   . Diabetes Sister   . Breast cancer Paternal Aunt     Social History   Socioeconomic History  . Marital status: Married    Spouse name: Not on file  . Number of children: Not on file  . Years of education: Not on file  . Highest education level: Not on file  Occupational History  . Not on file  Tobacco Use  . Smoking status: Never Smoker  . Smokeless tobacco: Never Used  Vaping Use  . Vaping Use: Never used  Substance and Sexual Activity  . Alcohol use: No  . Drug use: Never  . Sexual activity: Yes    Birth control/protection: Surgical    Comment: Hysterectomy -- ovaries remain  Other Topics Concern  . Not on file  Social History Narrative  . Not on file  Social Determinants of Health   Financial Resource Strain: Not on file  Food Insecurity: Not on file  Transportation Needs: Not on file  Physical Activity: Not on file  Stress: Not on file  Social Connections: Not on file  Intimate Partner Violence: Not on file    Review of Systems  Constitutional: Positive for fatigue.  Psychiatric/Behavioral: Positive for sleep disturbance.    Vitals:   04/14/20 1530  BP: (!) 146/92  Pulse: 89  Temp: 98 F (36.7 C)  SpO2: 97%     Physical Exam Vitals and nursing note reviewed.  Constitutional:      Appearance: She is obese.  HENT:     Right Ear: Tympanic membrane normal.     Mouth/Throat:     Mouth: Mucous membranes are moist.     Comments: Mallampati 3, crowded oropharynx Eyes:      General:        Right eye: No discharge.        Left eye: No discharge.     Pupils: Pupils are equal, round, and reactive to light.  Cardiovascular:     Heart sounds: No murmur heard. No friction rub.  Pulmonary:     Effort: Pulmonary effort is normal. No respiratory distress.     Breath sounds: Normal breath sounds. No stridor. No wheezing or rhonchi.  Neurological:     Mental Status: She is alert.    Results of the Epworth flowsheet 04/14/2020  Sitting and reading 1  Watching TV 2  Sitting, inactive in a public place (e.g. a theatre or a meeting) 0  As a passenger in a car for an hour without a break 3  Lying down to rest in the afternoon when circumstances permit 0  Sitting and talking to someone 0  Sitting quietly after a lunch without alcohol 0  In a car, while stopped for a few minutes in traffic 0  Total score 6    Data Reviewed: No previous study available  Assessment:  Moderate probability of significant obstructive sleep apnea  Uncontrolled hypertension  Pathophysiology of sleep disordered breathing reviewed with the patient Treatment options for sleep disordered breathing discussed with patient  Plan/Recommendations: We will schedule patient for home sleep study Treatment options were already reviewed  We will benefit from CPAP therapy  Tentative follow-up in 3 to 4 months  Weight loss efforts encouraged  Optimize sleep hygiene   Sherrilyn Rist MD [facility] 04/14/2020, 3:59 PM  CC: Lazoff, Shawn P, DO

## 2020-04-15 ENCOUNTER — Ambulatory Visit (HOSPITAL_COMMUNITY): Payer: BC Managed Care – PPO

## 2020-04-15 ENCOUNTER — Encounter (HOSPITAL_COMMUNITY): Payer: Self-pay

## 2020-05-26 ENCOUNTER — Ambulatory Visit: Payer: BC Managed Care – PPO

## 2020-05-26 ENCOUNTER — Other Ambulatory Visit: Payer: Self-pay

## 2020-05-26 DIAGNOSIS — Z87898 Personal history of other specified conditions: Secondary | ICD-10-CM

## 2020-05-26 DIAGNOSIS — G4733 Obstructive sleep apnea (adult) (pediatric): Secondary | ICD-10-CM | POA: Diagnosis not present

## 2020-05-27 ENCOUNTER — Telehealth: Payer: Self-pay | Admitting: Pulmonary Disease

## 2020-05-27 DIAGNOSIS — G4733 Obstructive sleep apnea (adult) (pediatric): Secondary | ICD-10-CM

## 2020-05-27 NOTE — Telephone Encounter (Signed)
ATC patient unable to reach LM to call back office (x1)  Left detailed message asking if she wanted to start CPAP therapy

## 2020-05-27 NOTE — Telephone Encounter (Signed)
Call patient  Sleep study result  Date of study: 05/26/2020  Impression: Moderate obstructive sleep apnea Mild oxygen desaturations  Recommendation: DME referral  Recommend CPAP therapy for moderate obstructive sleep apnea  Auto titrating CPAP with pressure settings of 5-15 will be appropriate  Encourage weight loss measures  Follow-up in the office 4 to 6 weeks following initiation of treatment

## 2020-05-27 NOTE — Telephone Encounter (Signed)
I tried to call patient, there was no answer, left VM for call patient.

## 2020-05-27 NOTE — Telephone Encounter (Signed)
I called and spoke with patient regarding HST results and Dr. Doreene Eland. Patient verbalized understanding and is agreeable to CPAP. I sent in the order and told patient DME will reach out next and that there maybe a delay in getting CPAP due to recall. Also notified patient to call us and make 4-6 weeks appt when receive machine. Patient verbalized understanding, nothing further needed.

## 2020-06-24 ENCOUNTER — Other Ambulatory Visit: Payer: Self-pay

## 2020-06-24 ENCOUNTER — Encounter: Payer: Self-pay | Admitting: Plastic Surgery

## 2020-06-24 ENCOUNTER — Ambulatory Visit (INDEPENDENT_AMBULATORY_CARE_PROVIDER_SITE_OTHER): Payer: BC Managed Care – PPO | Admitting: Plastic Surgery

## 2020-06-24 DIAGNOSIS — M549 Dorsalgia, unspecified: Secondary | ICD-10-CM | POA: Insufficient documentation

## 2020-06-24 DIAGNOSIS — G8929 Other chronic pain: Secondary | ICD-10-CM

## 2020-06-24 DIAGNOSIS — M542 Cervicalgia: Secondary | ICD-10-CM | POA: Insufficient documentation

## 2020-06-24 DIAGNOSIS — M546 Pain in thoracic spine: Secondary | ICD-10-CM

## 2020-06-24 DIAGNOSIS — N62 Hypertrophy of breast: Secondary | ICD-10-CM

## 2020-06-24 NOTE — Progress Notes (Signed)
Patient ID: Cheryl Mayer, female    DOB: 12-May-1965, 55 y.o.   MRN: [id number]   Chief Complaint  Patient presents with  . consult  . Breast Problem    Mammary Hyperplasia: The patient is a 55 y.o. female with a history of mammary hyperplasia for several years.  She has extremely large breasts causing symptoms that include the following: Back pain in the upper and lower back, including neck pain. She pulls or pins her bra straps to provide better lift and relief of the pressure and pain. She notices relief by holding her breast up manually.  Her shoulder straps cause grooves and pain and pressure that requires padding for relief. Pain medication is sometimes required with motrin and tylenol.  Activities that are hindered by enlarged breasts include: exercise and running.  She has tried supportive clothing as well as fitted bras without improvement.  Her breasts are extremely large and fairly symmetric.  She has hyperpigmentation of the inframammary area on both sides.  The sternal to nipple distance on the right is 28 cm and the left is 30 cm.  The IMF distance is 14 cm.  She is 5 feet 4 inches tall and weighs 185 pounds.  The BMI = 31.8.  Preoperative bra size = D cup.  The estimated excess breast tissue to be removed at the time of surgery = 600 grams on the left and 600 grams on the right.  Mammogram history: June 2021 and was negative.  She is due this coming month for this year's mammogram and plans to get it..  Family history of breast cancer: Grandmother and aunt.  Tobacco use: None.   She does not have a history of diabetes.  She has depression and migraines.  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  The patient expresses the desire to pursue surgical intervention.    Review of Systems  Constitutional: Positive for activity change. Negative for appetite change.  HENT: Negative.   Eyes: Negative.   Respiratory: Negative.  Negative for chest tightness.    Cardiovascular: Negative.  Negative for leg swelling.  Gastrointestinal: Negative for abdominal distention.  Endocrine: Negative.   Genitourinary: Negative.   Musculoskeletal: Positive for back pain and neck pain.  Skin: Positive for rash.  Hematological: Negative.   Psychiatric/Behavioral: Negative.     Past Medical History:  Diagnosis Date  . Anemia   . Depression   . Gestational diabetes   . HTN (hypertension)   . Migraines   . Obesity   . Pain in female genitalia on intercourse   . Syncope     Past Surgical History:  Procedure Laterality Date  . DILATION AND CURETTAGE OF UTERUS     x2  . LAPAROSCOPIC VAGINAL HYSTERECTOMY        Current Outpatient Medications:  .  acetaminophen (TYLENOL) 325 MG tablet, Take 650 mg by mouth every 6 (six) hours as needed for mild pain. , Disp: , Rfl:  .  amLODipine (NORVASC) 5 MG tablet, Take by mouth., Disp: , Rfl:  .  aspirin-acetaminophen-caffeine (EXCEDRIN MIGRAINE) 250-250-65 MG tablet, Take 1 tablet by mouth every 6 (six) hours as needed for headache., Disp: , Rfl:  .  cyanocobalamin (,VITAMIN B-12,) 1000 MCG/ML injection, Inject 1,000 mcg into the muscle every 30 (thirty) days., Disp: , Rfl:  .  NORCO 5-325 MG tablet, Take 1-2 tablets by mouth every 6 (six) hours as needed., Disp: , Rfl:  .  phentermine (  ADIPEX-P) 37.5 MG tablet, Take 18.75-37.5 mg by mouth See admin instructions. 1/2 to one tab daily, Disp: , Rfl:  .  QSYMIA 3.75-23 MG CP24, Take 1 capsule by mouth every morning., Disp: , Rfl:  .  topiramate (TOPAMAX) 25 MG tablet, Take 25 mg by mouth daily., Disp: , Rfl:    Objective:   Vitals:   06/24/20 0930  BP: (!) 160/87  Pulse: 80  SpO2: 97%    Physical Exam Vitals and nursing note reviewed.  Constitutional:      Appearance: Normal appearance.  HENT:     Head: Normocephalic and atraumatic.  Eyes:     Pupils: Pupils are equal, round, and reactive to light.  Cardiovascular:     Rate and Rhythm: Normal rate.      Pulses: Normal pulses.  Pulmonary:     Effort: Pulmonary effort is normal. No respiratory distress.  Abdominal:     General: Abdomen is flat. There is no distension.     Tenderness: There is no abdominal tenderness.  Musculoskeletal:        General: No swelling or deformity. Normal range of motion.  Skin:    General: Skin is warm.     Capillary Refill: Capillary refill takes less than 2 seconds.     Coloration: Skin is not jaundiced.     Findings: Erythema present. No bruising or lesion.  Neurological:     General: No focal deficit present.     Mental Status: She is alert and oriented to person, place, and time.  Psychiatric:        Mood and Affect: Mood normal.        Behavior: Behavior normal.     Assessment & Plan:  Chronic bilateral thoracic back pain  Neck pain  Symptomatic mammary hypertrophy  The procedure the patient selected and that was best for the patient was discussed. The risk were discussed and include but not limited to the following:  Breast asymmetry, fluid accumulation, firmness of the breast, inability to breast feed, loss of nipple or areola, skin loss, change in skin and nipple sensation, fat necrosis of the breast tissue, bleeding, infection and healing delay.  There are risks of anesthesia and injury to nerves or blood vessels.  Allergic reaction to tape, suture and skin glue are possible.  There will be swelling.  Any of these can lead to the need for revisional surgery.  A breast reduction has potential to interfere with diagnostic procedures in the future.  This procedure is best done when the breast is fully developed.  Changes in the breast will continue to occur over time: pregnancy, weight gain or weigh loss.    Total time: 45 minutes. This includes time spent with the patient during the visit as well as time spent before and after the visit reviewing the chart, documenting the encounter and ordering pertinent studies. and literature emailed to the  patient.   Physical therapy: Referral sent Mammogram: Plan for this years mammogram next month. Healthy Weight and Wellness: Consult placed  Patient is a very good candidate for bilateral breast reduction.  She will likely get had liposuction to the lateral area.  She knows to give [place] a call when she has completed physical therapy.  Pictures were obtained of the patient and placed in the chart with the patient's or guardian's permission.   [place], DO

## 2020-07-07 ENCOUNTER — Other Ambulatory Visit: Payer: Self-pay

## 2020-07-07 ENCOUNTER — Ambulatory Visit
Admission: RE | Admit: 2020-07-07 | Discharge: 2020-07-07 | Disposition: A | Discharge status: Home / Self Care | Payer: BC Managed Care – PPO | Source: Ambulatory Visit | Attending: Family Medicine | Admitting: Family Medicine

## 2020-07-07 DIAGNOSIS — R599 Enlarged lymph nodes, unspecified: Secondary | ICD-10-CM | POA: Insufficient documentation

## 2020-07-07 DIAGNOSIS — M542 Cervicalgia: Secondary | ICD-10-CM | POA: Diagnosis present

## 2020-07-22 ENCOUNTER — Other Ambulatory Visit: Payer: Self-pay

## 2020-07-22 ENCOUNTER — Ambulatory Visit: Payer: BC Managed Care – PPO | Attending: Plastic Surgery

## 2020-07-22 DIAGNOSIS — M542 Cervicalgia: Secondary | ICD-10-CM | POA: Diagnosis present

## 2020-07-22 DIAGNOSIS — M546 Pain in thoracic spine: Secondary | ICD-10-CM | POA: Insufficient documentation

## 2020-07-22 NOTE — Therapy (Signed)
[place], [place], [address] Phone: [phone]   Fax:  [phone]  Physical Therapy Evaluation  Patient Details  Name: Cheryl Mayer MRN: [id number] Date of Birth: December 19, 1965 Referring Provider (PT): Wallace Going, DO   Encounter Date: 07/22/2020   PT End of Session - 07/22/20 1322     Visit Number 1    Number of Visits 6    Date for PT Re-Evaluation 09/02/20    Authorization Type [employer] [place]    PT Start Time 1215    PT Stop Time 1308    PT Time Calculation (min) 53 min    Activity Tolerance Patient tolerated treatment well    Behavior During Therapy [facility] for tasks assessed/performed             Past Medical History:  Diagnosis Date   Anemia    Depression    Gestational diabetes    HTN (hypertension)    Migraines    Obesity    Pain in female genitalia on intercourse    Syncope     Past Surgical History:  Procedure Laterality Date   DILATION AND CURETTAGE OF UTERUS     x2   LAPAROSCOPIC VAGINAL HYSTERECTOMY      There were no vitals filed for this visit.    Subjective Assessment - 07/22/20 1218     Subjective Pt saw Dr. Marla Roe concerning a breast reduction due to intermittent cervical, parascapular, and BIL shoulder pain, along with recurrent migraines (2-3x/ month). Her pain is insidious in nature and began about a year ago. She works in [place], and her job requires moving patients throughout the day. She has had physical therapy in the past for lumbar pain. Her lumbar pain has since resolved. She describes the cervical pain as a "catch" and the parascapular pain as a dull ache. She denies any N/T related to this issue. The pt is right handed. The pt had a bunionectomy in June of last year and reports about a 10 pound weight gain following a 12-week recovery period of decreased physical activity. The pt still reports having L foot discomfort 1 year s/p the  surgery.    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 Unlimited    How long can you stand comfortably? 30 minutes    How long can you walk comfortably? Unlimited    Diagnostic tests None related to current issue    Patient Stated Goals Return to work and household chores like laundry and putting away dishes without pain.    Currently in Pain? Yes    Pain Score 3     Pain Location Neck    Pain Orientation Medial;Posterior    Pain Descriptors / Indicators Aching    Pain Type Chronic pain    Pain Radiating Towards BIL shoulders and parascapular region    Pain Onset More than a month ago    Pain Frequency Intermittent    Aggravating Factors  Moving patients, cervical rotation    Pain Relieving Factors Heat    Effect of Pain on Daily Activities Pt has difficulty performing job duties and household chores    Multiple Pain Sites No                [facility] PT Assessment - 07/22/20 0001       Assessment   Medical Diagnosis Pain in thoracic spine (M54.6), Cervicalgia (M54.2), Hypertrophy  of breast (N62)    Referring Provider (PT) Dillingham, Loel Lofty, DO    Onset Date/Surgical Date 07/23/19    Hand Dominance Right    Next MD Visit 07/29/2020    Prior Therapy For L-Spine 22 years ago      Precautions   Precautions None      Restrictions   Weight Bearing Restrictions No      Balance Screen   Has the patient fallen in the past 6 months No    Has the patient had a decrease in activity level because of a fear of falling?  No    Is the patient reluctant to leave their home because of a fear of falling?  No      Home Ecologist residence    Living Arrangements Spouse/significant other    Available Help at Discharge Family;Friend(s)    Type of [place] to enter    Entrance Stairs-Number of Steps 2    Entrance Stairs-Rails None    Home Layout One level    Home  Equipment None      Prior Function   Level of Independence Independent    Vocation Full time employment    Vocation Requirements Moving patients    Leisure Walking      Cognition   Overall Cognitive Status Within Functional Limits for tasks assessed      Posture/Postural Control   Posture/Postural Control Postural limitations    Postural Limitations Forward head;Rounded Shoulders      AROM   Right Shoulder Flexion --   WNL   Right Shoulder ABduction --   WNL   Right Shoulder Internal Rotation --   WNL   Right Shoulder External Rotation --   WNL   Left Shoulder Flexion --   WNL   Left Shoulder ABduction --   WNL   Left Shoulder Internal Rotation --   WNL   Left Shoulder External Rotation --   WNL   Cervical Flexion 40    Cervical Extension 40    Cervical - Right Side Bend 30p!    Cervical - Left Side Bend 40    Cervical - Right Rotation 52p!    Cervical - Left Rotation 48p!      PROM   Cervical Flexion WNL p!    Cervical Extension WNL    Cervical - Right Side Bend 50% limited p!    Cervical - Left Side Bend WNL    Cervical - Right Rotation 50% limited p!    Cervical - Left Rotation WNL      Strength   Overall Strength Within functional limits for tasks performed    Overall Strength Comments --   Cervical, BIL shoulder, elbow, wrist, and finger strength WNL     Flexibility   Soft Tissue Assessment /Muscle Length --   Upper trap, levator scapulae moderately limited BIL     Palpation   Palpation comment C3-C4, T4-T7, T9 CPA passive accessories hypomobile and painful; TTP to BIL mid/ upper trap with palpable trigger points      Special Tests   Cervical Tests Vertebral Artery Test      Vertebral Artery Test    Findings Positive    Side Right    Comment Pt experienced diplopia when performing test to the right. Pt did not experience blurred vision, nystagmus, dizziness, or dysarthria during testing on either side.  Objective  measurements completed on examination: See above findings.       [facility] Adult PT Treatment/Exercise - 07/22/20 0001       Neck Exercises: Standing   Other Standing Exercises Rows with RTB x10    Other Standing Exercises Chin tucks with ball at wal x10 with 5sec hold      Manual Therapy   Manual Therapy Joint mobilization    Joint Mobilization Grade V prone thoracic manipulation T4-T10 x3 with cavitation                    PT Education - 07/22/20 1320     Education Details Pt educated on importance of HEP adherence, as well as why cervical manipulation will not be performed at this time due to positive VBI findings. She was instructed to follow up with her provider concerning double vision when performing VBI testing.Pt was also instructed on proper form when performing newly added exercises.    Person(s) Educated Patient    Methods Explanation;Handout;Demonstration    Comprehension Verbalized understanding;Returned demonstration              PT Short Term Goals - 07/22/20 1402       PT SHORT TERM GOAL #1   Title Pt will demonstrate understanding and adherence to an HEP in order to become independent with the management of her primary impairments.    Baseline HEP given at evaluation    Time 3    Period Weeks    Status New    Target Date 08/12/20               PT Long Term Goals - 07/22/20 1404       PT LONG TERM GOAL #1   Title Pt will demonstrate BIL cervical roation of 60 degrees with 0/10 pain in order to drive without limitation.    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    Baseline Pt has pain when moving patients at work.    Time 6    Period Weeks    Status New    Target Date 09/02/20       PT LONG TERM GOAL #3   Title Pt will report ability to stand >30 minutes with 0/10 pain in order to cook and do dishes without difficulty.    Baseline Pt reports pain with 30 minutes of standing.    Time 6    Period Weeks    Status New    Target Date 09/02/20      PT LONG TERM GOAL #4   Title Pt will demonstrate R cervical side bend equal to L with 0/10 pain in order to perform self care such as brushing her hair without difficulty.    Baseline R cervical side bend = 30 degrees with pain    Time 6    Period Weeks    Status New    Target Date 09/02/20                    Plan - 07/22/20 1323     Clinical Impression Statement The pt is  a pleasant 55yo R hand dominant F who present with primary c/o cervical and parascapular pain of insidious onset lasting about 1 year. Upon assessment, her primary impairments include painful and hypomobile cervical and thoracic passive accessories, painful and limited cervical AROM in BIL rotation and side bending, limited BIL upper traps/ levator scapulae extensibility, and palpable trigger points throughout the BIL mid/upper traps. Her impairments are most indicative of facet dysfunction as the pain tends to start in the cervical spine and radiate towards the upper thoracic spine. Cannot rule out postural dysfunction as a contributing factor to the pt's pain. Grade V prone thoracic manipulation was performed, which resulted in therapeutic response. Prior to attempting a C-spine manipulation, vertebrobasilar artery insufficiency testing was performed BIL. This resulted in diplopia when performed to the R. As a result, cervical spine manipulation was deferred at this time pending confirmation that vertebrobasilar artery insufficiency is not present in the pt. No other symptoms, including dysarthria, dizziness, blurry vision, or nystagmus were found when testing on either side. The pt was instructed to follow up with her PCP regarding this problem.  Referral to ED was not pursued at this time due to very limited positive findings. Additionally, upon repeating the testing a second time, no adverse findings were noted.  The pt will benefit from skilled PT to address her primary impairments and help her return to her prior level of function without limitation due to pain,    Personal Factors and Comorbidities Comorbidity 1    Comorbidities HTN    Examination-Activity Limitations Lift;Stairs;Carry;Reach Overhead;Stand    Examination-Participation Restrictions Cleaning;Laundry;Occupation    Stability/Clinical Decision Making Stable/Uncomplicated    Clinical Decision Making Low    Rehab Potential Good    PT Frequency 1x / week    PT Duration 6 weeks    PT Treatment/Interventions ADLs/Self Care Home Management;Biofeedback;Moist Heat;Cryotherapy;Therapeutic exercise;Neuromuscular re-education;Manual techniques;Passive range of motion;Dry needling;Taping    PT Next Visit Plan Introduce DNF strengthening exercises and upper trap/ levator scapulae stretches    PT Home Exercise Plan [id number]    Consulted and Agree with Plan of Care Patient             Patient will benefit from skilled therapeutic intervention in order to improve the following deficits and impairments:  Decreased range of motion, Hypomobility, Postural dysfunction, Impaired flexibility, Pain  Visit Diagnosis: Cervicalgia  Pain in thoracic spine     Problem List Patient Active Problem List   Diagnosis Date Noted   Back pain 06/24/2020   Neck pain 06/24/2020   Symptomatic mammary hypertrophy 06/24/2020   Chest discomfort 08/09/2017   Bruit of left carotid artery 06/28/2017   Essential hypertension 06/22/2017   Leukocytosis 06/22/2017   AKI (acute kidney injury) ([place]) 06/22/2017   Dyspnea 06/22/2017   Syncope 06/21/2017   Benign neoplasm of skin of face 03/07/2017   Left shoulder pain 08/11/2016   Benign skin lesion 05/24/2015   Rosacea 05/24/2015   Attention  deficit disorder of adult 08/02/2014   Depression 08/02/2014   Dyshidrotic eczema 08/02/2014   Migraines 08/02/2014   Overweight (BMI 25.0-29.9) 08/02/2014   Symptomatic menopausal or female climacteric states 08/02/2014    Cheryl Mayer, PT, DPT 07/22/20 2:13 PM   [facility] [address] [place], [place], [address] Phone: [phone]   Fax:  [phone]  Name: Cheryl Mayer MRN: [id number] Date of Birth: November 10, 1965

## 2020-07-22 NOTE — Patient Instructions (Signed)
  [id number]

## 2020-07-29 ENCOUNTER — Other Ambulatory Visit: Payer: Self-pay

## 2020-07-29 ENCOUNTER — Ambulatory Visit: Payer: BC Managed Care – PPO

## 2020-07-29 DIAGNOSIS — M542 Cervicalgia: Secondary | ICD-10-CM | POA: Diagnosis not present

## 2020-07-29 DIAGNOSIS — M546 Pain in thoracic spine: Secondary | ICD-10-CM

## 2020-07-29 NOTE — Patient Instructions (Signed)
   [id number]

## 2020-07-29 NOTE — Therapy (Signed)
[place], [place], [address] Phone: [phone]   Fax:  [phone]  Physical Therapy Treatment  Patient Details  Name: Cheryl Mayer MRN: [id number] Date of Birth: [birth year] Referring Provider (PT): Wallace Going, DO   Encounter Date: 07/29/2020   PT End of Session - 07/29/20 1659     Visit Number 2    Number of Visits 6    Date for PT Re-Evaluation 09/02/20    Authorization Type [employer] [place]    PT Start Time 1615    PT Stop Time 1700    PT Time Calculation (min) 45 min    Activity Tolerance Patient tolerated treatment well    Behavior During Therapy [facility] for tasks assessed/performed             Past Medical History:  Diagnosis Date   Anemia    Depression    Gestational diabetes    HTN (hypertension)    Migraines    Obesity    Pain in female genitalia on intercourse    Syncope     Past Surgical History:  Procedure Laterality Date   DILATION AND CURETTAGE OF UTERUS     x2   LAPAROSCOPIC VAGINAL HYSTERECTOMY      There were no vitals filed for this visit.   Subjective Assessment - 07/29/20 1613     Subjective Pt reports performing her HEP regularly with some minor soreness. She adds that she has increased soreness when turning her head to the R.    Currently in Pain? Yes    Pain Score 2     Pain Location Neck    Pain Orientation Right;Mid    Pain Descriptors / Indicators Aching    Pain Radiating Towards Midline parascapular region                [facility] PT Assessment - 07/29/20 0001       Observation/Other Assessments   Observations C6-T4 CPA's/ R UPA's limited and painful                           [facility] Adult PT Treatment/Exercise - 07/29/20 0001       Neck Exercises: Standing   Other Standing Exercises A/T/Y with YTB and chin tuck hold 2x10 superset    Other Standing Exercises Lower trap lift off at wall 2x10      Neck  Exercises: Seated   Other Seated Exercise SNAGs x10 with 5sec hold BIL      Manual Therapy   Manual Therapy Joint mobilization;Muscle Energy Technique    Joint Mobilization Grade IV Prone CT junction mobilization x52mn BIL with cavitation BIL    Muscle Energy Technique Cervical rotation contract relax x 5 to the R                    PT Education - 07/29/20 1658     Education Details Pt educated on anatomy and physiology underlying facet dysfunction. She was also instructed on proper form when performing newly added exercises.    Person(s) Educated Patient    Methods Explanation;Demonstration;Handout;Tactile cues;Verbal cues    Comprehension Verbal cues required;Tactile cues required;Returned demonstration;Verbalized understanding              PT Short Term Goals - 07/29/20 1705       PT SHORT TERM GOAL #1   Title Pt will demonstrate understanding and adherence to an HEP in  order to become independent with the management of her primary impairments.    Baseline Pt reports understanding and regular adherence to her HEP.    Time 3    Period Weeks    Status Achieved    Target Date 08/12/20               PT Long Term Goals - 07/22/20 1404       PT LONG TERM GOAL #1   Title Pt will demonstrate BIL cervical roation of 60 degrees with 0/10 pain in order to drive without limitation.    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    Baseline Pt has pain when moving patients at work.    Time 6    Period Weeks    Status New    Target Date 09/02/20      PT LONG TERM GOAL #3   Title Pt will report ability to stand >30 minutes with 0/10 pain in order to cook and do dishes without difficulty.    Baseline Pt reports  pain with 30 minutes of standing.    Time 6    Period Weeks    Status New    Target Date 09/02/20      PT LONG TERM GOAL #4   Title Pt will demonstrate R cervical side bend equal to L with 0/10 pain in order to perform self care such as brushing her hair without difficulty.    Baseline R cervical side bend = 30 degrees with pain    Time 6    Period Weeks    Status New    Target Date 09/02/20                   Plan - 07/29/20 1702     Clinical Impression Statement Pt responded well to all treatment today, demonstrating proper form with all new exercises without increase in pain. She reported decrease in pain following CT junction mobilization, cervical rotation contract/relax MET, and cervical SNAG's. Her upper thoracic and lower C-spine mobility were greatly improved upon re-assessment of passive accessories following CT junction mobilization. She leaves clinic with 1/10 pain and vastly improved cervical mobility per visual observation. She will continue to benefit from skilled PT to address her primary impairments and to help her return to her prior level of function without limitation.    Stability/Clinical Decision Making Stable/Uncomplicated    Clinical Decision Making Low    Rehab Potential Good    PT Frequency 1x / week    PT Duration 6 weeks    PT Treatment/Interventions ADLs/Self Care Home Management;Biofeedback;Moist Heat;Cryotherapy;Therapeutic exercise;Neuromuscular re-education;Manual techniques;Passive range of motion;Dry needling;Taping    PT Next Visit Plan Introduce DNF strengthening exercises and upper trap/ levator scapulae stretches; progress scapular strengthening and cervical mobility exercises    PT Home Exercise Plan [id number]    Consulted and Agree with Plan of Care Patient             Patient will benefit from skilled therapeutic intervention in order to improve the following deficits and impairments:  Decreased range of motion, Hypomobility,  Postural dysfunction, Impaired  flexibility, Pain  Visit Diagnosis: Cervicalgia  Pain in thoracic spine     Problem List Patient Active Problem List   Diagnosis Date Noted   Back pain 06/24/2020   Neck pain 06/24/2020   Symptomatic mammary hypertrophy 06/24/2020   Chest discomfort 08/09/2017   Bruit of left carotid artery 06/28/2017   Essential hypertension 06/22/2017   Leukocytosis 06/22/2017   AKI (acute kidney injury) ([place]) 06/22/2017   Dyspnea 06/22/2017   Syncope 06/21/2017   Benign neoplasm of skin of face 03/07/2017   Left shoulder pain 08/11/2016   Benign skin lesion 05/24/2015   Rosacea 05/24/2015   Attention deficit disorder of adult 08/02/2014   Depression 08/02/2014   Dyshidrotic eczema 08/02/2014   Migraines 08/02/2014   Overweight (BMI 25.0-29.9) 08/02/2014   Symptomatic menopausal or female climacteric states 08/02/2014    Vanessa [place], PT, DPT 07/29/20 5:07 PM   [facility] [address] [place], [place], [address] Phone: [phone]   Fax:  [phone]  Name: LIBA HULSEY MRN: [id number] Date of Birth: [birth year]

## 2020-08-05 ENCOUNTER — Telehealth (INDEPENDENT_AMBULATORY_CARE_PROVIDER_SITE_OTHER): Payer: BC Managed Care – PPO | Admitting: Plastic Surgery

## 2020-08-05 ENCOUNTER — Encounter: Payer: Self-pay | Admitting: Plastic Surgery

## 2020-08-05 ENCOUNTER — Other Ambulatory Visit: Payer: Self-pay

## 2020-08-05 ENCOUNTER — Ambulatory Visit: Payer: BC Managed Care – PPO | Attending: Plastic Surgery

## 2020-08-05 DIAGNOSIS — M546 Pain in thoracic spine: Secondary | ICD-10-CM | POA: Insufficient documentation

## 2020-08-05 DIAGNOSIS — N62 Hypertrophy of breast: Secondary | ICD-10-CM | POA: Diagnosis not present

## 2020-08-05 DIAGNOSIS — G8929 Other chronic pain: Secondary | ICD-10-CM | POA: Diagnosis not present

## 2020-08-05 DIAGNOSIS — M542 Cervicalgia: Secondary | ICD-10-CM | POA: Insufficient documentation

## 2020-08-05 NOTE — Therapy (Signed)
[place], [place], [address] Phone: [phone]   Fax:  [phone]  Physical Therapy Treatment  Patient Details  Name: Cheryl Mayer MRN: [id number] Date of Birth: 1965-02-25 Referring Provider (PT): Wallace Going, DO   Encounter Date: 08/05/2020   PT End of Session - 08/05/20 1848     Visit Number 3    Number of Visits 6    Date for PT Re-Evaluation 09/02/20    Authorization Type [employer] [place]    PT Start Time 1745    PT Stop Time 1830    PT Time Calculation (min) 45 min    Activity Tolerance Patient tolerated treatment well    Behavior During Therapy WFL for tasks assessed/performed             Past Medical History:  Diagnosis Date   Anemia    Depression    Gestational diabetes    HTN (hypertension)    Migraines    Obesity    Pain in female genitalia on intercourse    Syncope     Past Surgical History:  Procedure Laterality Date   DILATION AND CURETTAGE OF UTERUS     x2   LAPAROSCOPIC VAGINAL HYSTERECTOMY      There were no vitals filed for this visit.   Subjective Assessment - 08/05/20 1752     Subjective Pt reports R sided cervical soreness today. She adds that she had a headache a few days aftre last visit, but this has since resolved. She adds that she has been adherent with her HEP, although she has not had time to perform all exercises everyday.    Currently in Pain? Yes    Pain Score 3     Pain Location Neck    Pain Orientation Right;Mid    Pain Descriptors / Indicators Aching;Sore    Pain Type Chronic pain                               [facility] Adult PT Treatment/Exercise - 08/05/20 0001       Neck Exercises: Standing   Other Standing Exercises Chin tucks with rotation with ball at wall 2x10      Neck Exercises: Supine   Other Supine Exercise DNF endurance isometric with head off table 3x to exhaustion      Manual Therapy    Manual Therapy Muscle Energy Technique;Manual Traction;Soft tissue mobilization    Soft tissue mobilization suboccipital release    Manual Traction cervical manual traction    Muscle Energy Technique Cervical rotation contract relax x 5 to the R                    PT Education - 08/05/20 1830     Education Details Pt instructed to utilize heating pad for pain relief at home. Also instructed on proper form when performing newly added exercises.    Person(s) Educated Patient    Methods Explanation;Demonstration;Handout    Comprehension Returned demonstration;Verbalized understanding              PT Short Term Goals - 07/29/20 1705       PT SHORT TERM GOAL #1   Title Pt will demonstrate understanding and adherence to an HEP in order to become independent with the management of her primary impairments.    Baseline Pt reports understanding and regular adherence to her HEP.    Time  3    Period Weeks    Status Achieved    Target Date 08/12/20               PT Long Term Goals - 07/22/20 1404       PT LONG TERM GOAL #1   Title Pt will demonstrate BIL cervical roation of 60 degrees with 0/10 pain in order to drive without limitation.    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    Baseline Pt has pain when moving patients at work.    Time 6    Period Weeks    Status New    Target Date 09/02/20      PT LONG TERM GOAL #3   Title Pt will report ability to stand >30 minutes with 0/10 pain in order to cook and do dishes without difficulty.    Baseline Pt reports pain with 30 minutes of standing.    Time 6    Period Weeks    Status New    Target Date 09/02/20      PT LONG TERM GOAL #4   Title Pt will  demonstrate R cervical side bend equal to L with 0/10 pain in order to perform self care such as brushing her hair without difficulty.    Baseline R cervical side bend = 30 degrees with pain    Time 6    Period Weeks    Status New    Target Date 09/02/20                   Plan - 08/05/20 1849     Clinical Impression Statement Pt responded well to all treatment today, demonstrating increased cervical rotation AROM following cervical MET, as well as decreased pain with suboccipital release. She tolerated DNF strengthening exercises well, demonstrating proper form and no increase in pain. She will continue to benefit from skilled PT to address her primary impairments and to return to her prior level of function without limitation.    Personal Factors and Comorbidities Comorbidity 1    Comorbidities HTN    Examination-Activity Limitations Lift;Stairs;Carry;Reach Overhead;Stand    Examination-Participation Restrictions Cleaning;Laundry;Occupation    Stability/Clinical Decision Making Stable/Uncomplicated    Clinical Decision Making Low    Rehab Potential Good    PT Frequency 1x / week    PT Duration 6 weeks    PT Treatment/Interventions ADLs/Self Care Home Management;Biofeedback;Moist Heat;Cryotherapy;Therapeutic exercise;Neuromuscular re-education;Manual techniques;Passive range of motion;Dry needling;Taping    PT Next Visit Plan Introduce upper trap/ levator scapulae stretches; progress scapular strengthening and cervical mobility exercises    PT Home Exercise Plan [id number]    Consulted and Agree with Plan of Care Patient             Patient will benefit from skilled therapeutic intervention in order to improve the following deficits and impairments:  Decreased range of motion, Hypomobility, Postural dysfunction, Impaired flexibility, Pain  Visit Diagnosis: Cervicalgia  Pain in thoracic spine     Problem List Patient Active Problem List   Diagnosis Date Noted   Back  pain 06/24/2020   Neck pain 06/24/2020  Symptomatic mammary hypertrophy 06/24/2020   Chest discomfort 08/09/2017   Bruit of left carotid artery 06/28/2017   Essential hypertension 06/22/2017   Leukocytosis 06/22/2017   AKI (acute kidney injury) ([place]) 06/22/2017   Dyspnea 06/22/2017   Syncope 06/21/2017   Benign neoplasm of skin of face 03/07/2017   Left shoulder pain 08/11/2016   Benign skin lesion 05/24/2015   Rosacea 05/24/2015   Attention deficit disorder of adult 08/02/2014   Depression 08/02/2014   Dyshidrotic eczema 08/02/2014   Migraines 08/02/2014   Overweight (BMI 25.0-29.9) 08/02/2014   Symptomatic menopausal or female climacteric states 08/02/2014    Vanessa [place], PT, DPT 08/05/20 6:52 PM   [facility] [address] [place], [place], [address] Phone: [phone]   Fax:  [phone]  Name: Cheryl Mayer MRN: [id number] Date of Birth: [birth year]

## 2020-08-05 NOTE — Progress Notes (Signed)
   Subjective:    Patient ID: Cheryl Mayer, female    DOB: May 09, 1965, 55 y.o.   MRN: [id number]  The patient is a 55 year old female joining me by telemetry visit to discuss her memory hypertrophy further.  She was seen in May with concerns about back pain.  Her breasts are extremely large and she has hyperpigmentation at the inframammary folds with a documented sternal to nipple distance on the right of 28 cm and on the left 30 cm.  She is 5 feet 4 inches tall and still weighs 185 pounds giving her a BMI of 31.8.  Her preoperative bra size has not changed and is a D cup.  She has started physical therapy.  It is helping some but there is concerned that it will not eliminate her neck or back pain.  Her mammogram is due this month and she is going to call to get that scheduled.  She also mentioned wanting to get hemoglobin A1c checked and I recommend that she do that through her primary care physician.  We are still estimating 600 g for removal of the breast tissue on each side.   Review of Systems  Constitutional: Negative.   Eyes: Negative.   Respiratory: Negative.    Cardiovascular: Negative.   Gastrointestinal: Negative.   Genitourinary: Negative.       Objective:   Physical Exam      Assessment & Plan:     ICD-10-CM   1. Symptomatic mammary hypertrophy  N62     2. Chronic bilateral thoracic back pain  M54.6    G89.29       I connected with  Cheryl Mayer on 08/05/20 by a video enabled telemedicine application and verified that I am speaking with the correct person using two identifiers.  The patient was at home and I was in my office.  We spent approximately 15 minutes in the following way: Discussion, review of charts, review of information as listed above and completing her chart today for documentation of this visit.  The plan is to finish physical therapy, try and decrease weight by 5 pounds, get the mammogram and check hemoglobin A1c.  Patient knows to give [place] a  call once she has completed those things.   I discussed the limitations of evaluation and management by telemedicine. The patient expressed understanding and agreed to proceed.

## 2020-08-05 NOTE — Patient Instructions (Signed)
  [id number]

## 2020-08-12 ENCOUNTER — Ambulatory Visit: Payer: BC Managed Care – PPO

## 2020-08-12 ENCOUNTER — Other Ambulatory Visit: Payer: Self-pay

## 2020-08-12 DIAGNOSIS — M542 Cervicalgia: Secondary | ICD-10-CM | POA: Diagnosis not present

## 2020-08-12 DIAGNOSIS — M546 Pain in thoracic spine: Secondary | ICD-10-CM

## 2020-08-12 NOTE — Therapy (Signed)
[place], [place], [address] Phone: [phone]   Fax:  [phone]  Physical Therapy Treatment  Patient Details  Name: Cheryl Mayer MRN: [id number] Date of Birth: 22-Aug-1965 Referring Provider (PT): Wallace Going, DO   Encounter Date: 08/12/2020   PT End of Session - 08/12/20 1821     Visit Number 4    Number of Visits 6    Date for PT Re-Evaluation 09/02/20    Authorization Type [employer] [place]    PT Start Time 1745    PT Stop Time 1830    PT Time Calculation (min) 45 min    Activity Tolerance Patient tolerated treatment well    Behavior During Therapy WFL for tasks assessed/performed             Past Medical History:  Diagnosis Date   Anemia    Depression    Gestational diabetes    HTN (hypertension)    Migraines    Obesity    Pain in female genitalia on intercourse    Syncope     Past Surgical History:  Procedure Laterality Date   DILATION AND CURETTAGE OF UTERUS     x2   LAPAROSCOPIC VAGINAL HYSTERECTOMY      There were no vitals filed for this visit.   Subjective Assessment - 08/12/20 1737     Subjective Pt reports only minor R sided cervical soreness when turning her head to the R. She reports continued HEP adherence, performing her exercises daily.    Currently in Pain? Yes    Pain Score 1     Pain Location Neck    Pain Orientation Right    Pain Descriptors / Indicators Aching;Sore                               [facility] Adult PT Treatment/Exercise - 08/12/20 0001       Neck Exercises: Standing   Other Standing Exercises Shoulder rolls forward and backward with chin tuck hold 2x10 each direction      Neck Exercises: Prone   Neck Retraction --   Quadruped chin tuck with cervical rotation against YTB 2x10 BIL   Other Prone Exercise Horizontal abduction with 3# weights with chest on bolster 2x10    Other Prone Exercise Prone neck  extension with chest on bolster 3x to exhaustion      Modalities   Modalities Moist Heat      Moist Heat Therapy   Number Minutes Moist Heat 10 Minutes    Moist Heat Location Cervical      Manual Therapy   Manual Therapy Muscle Energy Technique;Manual Traction;Soft tissue mobilization;Joint mobilization    Joint Mobilization Grade IV Prone CT junction mobilization x65mn BIL with cavitation BIL    Soft tissue mobilization suboccipital release    Manual Traction cervical manual traction    Muscle Energy Technique Cervical rotation contract relax x 5 to the R                    PT Education - 08/12/20 1820     Education Details Pt instructed on proper form when performing newly added exercises.    Person(s) Educated Patient    Methods Explanation;Demonstration;Handout    Comprehension Verbalized understanding;Returned demonstration              PT Short Term Goals - 07/29/20 1705  PT SHORT TERM GOAL #1   Title Pt will demonstrate understanding and adherence to an HEP in order to become independent with the management of her primary impairments.    Baseline Pt reports understanding and regular adherence to her HEP.    Time 3    Period Weeks    Status Achieved    Target Date 08/12/20               PT Long Term Goals - 07/22/20 1404       PT LONG TERM GOAL #1   Title Pt will demonstrate BIL cervical roation of 60 degrees with 0/10 pain in order to drive without limitation.    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    Baseline Pt has pain when moving patients at work.    Time 6    Period Weeks    Status New    Target Date 09/02/20      PT LONG TERM GOAL #3   Title Pt will report ability  to stand >30 minutes with 0/10 pain in order to cook and do dishes without difficulty.    Baseline Pt reports pain with 30 minutes of standing.    Time 6    Period Weeks    Status New    Target Date 09/02/20      PT LONG TERM GOAL #4   Title Pt will demonstrate R cervical side bend equal to L with 0/10 pain in order to perform self care such as brushing her hair without difficulty.    Baseline R cervical side bend = 30 degrees with pain    Time 6    Period Weeks    Status New    Target Date 09/02/20                   Plan - 08/12/20 1824     Clinical Impression Statement Pt responded well to all treatment today, demonstrating increased cervical rotation AROM following cervical MET and CT junction mobilization, as well as decreased pain with suboccipital release. She tolerated DNF strengthening exercises well, demonstrating proper form and minor increase in pain. She reports decreased pain following moist heat to conclude the session. She leaves with 1/10 pain. She will continue to benefit from skilled PT to address her primary impairments and to return to her prior level of function without limitation.    Personal Factors and Comorbidities Comorbidity 1    Comorbidities HTN    Examination-Activity Limitations Lift;Stairs;Carry;Reach Overhead;Stand    Examination-Participation Restrictions Cleaning;Laundry;Occupation    Stability/Clinical Decision Making Stable/Uncomplicated    Clinical Decision Making Low    Rehab Potential Good    PT Frequency 1x / week    PT Duration 6 weeks    PT Treatment/Interventions ADLs/Self Care Home Management;Biofeedback;Moist Heat;Cryotherapy;Therapeutic exercise;Neuromuscular re-education;Manual techniques;Passive range of motion;Dry needling;Taping    PT Next Visit Plan Introduce upper trap/ levator scapulae stretches; progress scapular strengthening and cervical mobility exercises    PT Home Exercise Plan [id number]    Consulted and Agree with  Plan of Care Patient  Patient will benefit from skilled therapeutic intervention in order to improve the following deficits and impairments:  Decreased range of motion, Hypomobility, Postural dysfunction, Impaired flexibility, Pain  Visit Diagnosis: Cervicalgia  Pain in thoracic spine     Problem List Patient Active Problem List   Diagnosis Date Noted   Back pain 06/24/2020   Neck pain 06/24/2020   Symptomatic mammary hypertrophy 06/24/2020   Chest discomfort 08/09/2017   Bruit of left carotid artery 06/28/2017   Essential hypertension 06/22/2017   Leukocytosis 06/22/2017   AKI (acute kidney injury) ([place]) 06/22/2017   Dyspnea 06/22/2017   Syncope 06/21/2017   Benign neoplasm of skin of face 03/07/2017   Left shoulder pain 08/11/2016   Benign skin lesion 05/24/2015   Rosacea 05/24/2015   Attention deficit disorder of adult 08/02/2014   Depression 08/02/2014   Dyshidrotic eczema 08/02/2014   Migraines 08/02/2014   Overweight (BMI 25.0-29.9) 08/02/2014   Symptomatic menopausal or female climacteric states 08/02/2014    Vanessa Terral, PT, DPT 08/12/20 6:28 PM   [facility] [address] [place], [place], [address] Phone: [phone]   Fax:  [phone]  Name: HELGA ASBURY MRN: [id number] Date of Birth: 1966-01-28

## 2020-08-12 NOTE — Patient Instructions (Signed)
  [id number]

## 2020-08-13 ENCOUNTER — Other Ambulatory Visit: Payer: Self-pay | Admitting: Family Medicine

## 2020-08-13 ENCOUNTER — Ambulatory Visit
Admission: RE | Admit: 2020-08-13 | Discharge: 2020-08-13 | Disposition: A | Discharge status: Home / Self Care | Payer: BC Managed Care – PPO | Source: Ambulatory Visit | Attending: Family Medicine | Admitting: Family Medicine

## 2020-08-13 DIAGNOSIS — Z1231 Encounter for screening mammogram for malignant neoplasm of breast: Secondary | ICD-10-CM

## 2020-08-19 ENCOUNTER — Other Ambulatory Visit: Payer: Self-pay

## 2020-08-19 ENCOUNTER — Ambulatory Visit: Payer: BC Managed Care – PPO

## 2020-08-19 DIAGNOSIS — M542 Cervicalgia: Secondary | ICD-10-CM | POA: Diagnosis not present

## 2020-08-19 DIAGNOSIS — M546 Pain in thoracic spine: Secondary | ICD-10-CM

## 2020-08-19 NOTE — Patient Instructions (Addendum)
   [id number]

## 2020-08-19 NOTE — Therapy (Signed)
[place], [place], [address] Phone: [phone]   Fax:  [phone]  Physical Therapy Treatment  Patient Details  Name: Cheryl Mayer MRN: [id number] Date of Birth: September 25, 1965 Referring Provider (PT): Wallace Going, DO   Encounter Date: 08/19/2020   PT End of Session - 08/19/20 1823     Visit Number 5    Number of Visits 6    Date for PT Re-Evaluation 09/02/20    Authorization Type [employer] [place]    PT Start Time 1745    PT Stop Time 1830    PT Time Calculation (min) 45 min    Activity Tolerance Patient tolerated treatment well    Behavior During Therapy WFL for tasks assessed/performed             Past Medical History:  Diagnosis Date   Anemia    Depression    Gestational diabetes    HTN (hypertension)    Migraines    Obesity    Pain in female genitalia on intercourse    Syncope     Past Surgical History:  Procedure Laterality Date   DILATION AND CURETTAGE OF UTERUS     x2   LAPAROSCOPIC VAGINAL HYSTERECTOMY      There were no vitals filed for this visit.   Subjective Assessment - 08/19/20 1746     Subjective Pt reports only minor R sided cervical soreness when turning her head to the R. Otherwise, she reports doing better since starting PT. She reports continued HEP adherence, performing her exercises daily.    Currently in Pain? No/denies    Pain Score 0-No pain                               [facility] Adult PT Treatment/Exercise - 08/19/20 0001       Neck Exercises: Theraband   Other Theraband Exercises Dynamic isometric chin tuck against RTB while performing forward and side to side hip hinge 2x10 each      Neck Exercises: Standing   Other Standing Exercises PNF D2 shoulder flexion with chin tuck YTB 2x10 BIL      Modalities   Modalities Moist Heat      Moist Heat Therapy   Number Minutes Moist Heat 10 Minutes    Moist Heat Location  Cervical      Manual Therapy   Manual Therapy Muscle Energy Technique;Manual Traction;Soft tissue mobilization;Joint mobilization    Joint Mobilization Grade IV Prone CT junction mobilization x76mn BIL with cavitation BIL    Soft tissue mobilization suboccipital release    Manual Traction cervical manual traction    Muscle Energy Technique Cervical rotation contract relax x 5 to the R      Neck Exercises: Stretches   Upper Trapezius Stretch 60 seconds    Levator Stretch 60 seconds                    PT Education - 08/19/20 1823     Education Details Pt instructed on proper form when performing newly added exercises and importance of regular mobility exercises into cervical rotation to restore AROM without pain.    Person(s) Educated Patient    Methods Explanation;Demonstration;Handout    Comprehension Verbalized understanding;Returned demonstration              PT Short Term Goals - 07/29/20 1705       PT SHORT TERM  GOAL #1   Title Pt will demonstrate understanding and adherence to an HEP in order to become independent with the management of her primary impairments.    Baseline Pt reports understanding and regular adherence to her HEP.    Time 3    Period Weeks    Status Achieved    Target Date 08/12/20               PT Long Term Goals - 07/22/20 1404       PT LONG TERM GOAL #1   Title Pt will demonstrate BIL cervical roation of 60 degrees with 0/10 pain in order to drive without limitation.    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    Baseline Pt has pain when moving patients at work.    Time 6    Period Weeks    Status New    Target Date 09/02/20      PT LONG TERM GOAL #3   Title Pt will  report ability to stand >30 minutes with 0/10 pain in order to cook and do dishes without difficulty.    Baseline Pt reports pain with 30 minutes of standing.    Time 6    Period Weeks    Status New    Target Date 09/02/20      PT LONG TERM GOAL #4   Title Pt will demonstrate R cervical side bend equal to L with 0/10 pain in order to perform self care such as brushing her hair without difficulty.    Baseline R cervical side bend = 30 degrees with pain    Time 6    Period Weeks    Status New    Target Date 09/02/20                   Plan - 08/19/20 1824     Clinical Impression Statement Pt responded well to all treatment today, demonstrating increased cervical rotation AROM following cervical MET and CT junction mobilization, as well as decreased pain with suboccipital release/ manual distraction. She tolerated new exercises well, demonstrating proper form and minor increase in pain. She reports decreased pain following moist heat to conclude the session. She leaves with 0/10 pain. She will continue to benefit from skilled PT to address her primary impairments and to return to her prior level of function without limitation.    Personal Factors and Comorbidities Comorbidity 1    Comorbidities HTN    Examination-Activity Limitations Lift;Stairs;Carry;Reach Overhead;Stand    Examination-Participation Restrictions Cleaning;Laundry;Occupation    Stability/Clinical Decision Making Stable/Uncomplicated    Clinical Decision Making Low    Rehab Potential Good    PT Frequency 1x / week    PT Duration 6 weeks    PT Treatment/Interventions ADLs/Self Care Home Management;Biofeedback;Moist Heat;Cryotherapy;Therapeutic exercise;Neuromuscular re-education;Manual techniques;Passive range of motion;Dry needling;Taping    PT Next Visit Plan progress scapular strengthening and cervical mobility exercises, Progress note at next visit*    PT Home Exercise Plan [id number]    Consulted and Agree with  Plan of Care Patient  Patient will benefit from skilled therapeutic intervention in order to improve the following deficits and impairments:  Decreased range of motion, Hypomobility, Postural dysfunction, Impaired flexibility, Pain  Visit Diagnosis: Cervicalgia  Pain in thoracic spine     Problem List Patient Active Problem List   Diagnosis Date Noted   Back pain 06/24/2020   Neck pain 06/24/2020   Symptomatic mammary hypertrophy 06/24/2020   Chest discomfort 08/09/2017   Bruit of left carotid artery 06/28/2017   Essential hypertension 06/22/2017   Leukocytosis 06/22/2017   AKI (acute kidney injury) ([place]) 06/22/2017   Dyspnea 06/22/2017   Syncope 06/21/2017   Benign neoplasm of skin of face 03/07/2017   Left shoulder pain 08/11/2016   Benign skin lesion 05/24/2015   Rosacea 05/24/2015   Attention deficit disorder of adult 08/02/2014   Depression 08/02/2014   Dyshidrotic eczema 08/02/2014   Migraines 08/02/2014   Overweight (BMI 25.0-29.9) 08/02/2014   Symptomatic menopausal or female climacteric states 08/02/2014    Vanessa [place], PT, DPT 08/19/20 6:28 PM   [facility] [address] [place], [place], [address] Phone: [phone]   Fax:  [phone]  Name: ADELLYN CAPEK MRN: [id number] Date of Birth: 03-19-1965

## 2020-08-26 ENCOUNTER — Ambulatory Visit: Payer: BC Managed Care – PPO

## 2020-08-26 ENCOUNTER — Other Ambulatory Visit: Payer: Self-pay

## 2020-08-26 DIAGNOSIS — M542 Cervicalgia: Secondary | ICD-10-CM | POA: Diagnosis not present

## 2020-08-26 DIAGNOSIS — M546 Pain in thoracic spine: Secondary | ICD-10-CM

## 2020-08-26 NOTE — Patient Instructions (Signed)
  [id number]

## 2020-08-26 NOTE — Therapy (Signed)
[place], [place], [address] Phone: [phone]   Fax:  [phone]  Physical Therapy Treatment/ Discharge Summary  Patient Details  Name: Cheryl Mayer MRN: [id number] Date of Birth: 1965-11-05 Referring Provider (PT): Wallace Going, DO   Encounter Date: 08/26/2020   PT End of Session - 08/26/20 1809     Visit Number 6    Number of Visits 6    Authorization Type [employer] [place]    PT Start Time 1732    PT Stop Time 6606    PT Time Calculation (min) 38 min    Activity Tolerance Patient tolerated treatment well    Behavior During Therapy WFL for tasks assessed/performed             Past Medical History:  Diagnosis Date   Anemia    Depression    Gestational diabetes    HTN (hypertension)    Migraines    Obesity    Pain in female genitalia on intercourse    Syncope     Past Surgical History:  Procedure Laterality Date   DILATION AND CURETTAGE OF UTERUS     x2   LAPAROSCOPIC VAGINAL HYSTERECTOMY      There were no vitals filed for this visit.       [facility] PT Assessment - 08/26/20 0001       AROM   Cervical Flexion 40    Cervical Extension 40    Cervical - Right Side Bend 40p!    Cervical - Left Side Bend 40    Cervical - Right Rotation 66    Cervical - Left Rotation 60      Palpation   Palpation comment T1-T7 CPA hypomobile and painful                           [facility] Adult PT Treatment/Exercise - 08/26/20 0001       Neck Exercises: Standing   Other Standing Exercises Serratus punches with GTB 3x10    Other Standing Exercises Lat pulldowns with GTB 2x10      Manual Therapy   Manual Therapy Muscle Energy Technique    Muscle Energy Technique CIndependent cervical rotation contract relax x 5 to the R                    PT Education - 08/26/20 1808     Education Details Pt instructed on proper form when performing newly added  exercises. Also educated on importance of continued HEP adherence and progression following discharge from PT.    Person(s) Educated Patient    Methods Explanation;Demonstration;Handout    Comprehension Verbalized understanding;Returned demonstration              PT Short Term Goals - 07/29/20 1705       PT SHORT TERM GOAL #1   Title Pt will demonstrate understanding and adherence to an HEP in order to become independent with the management of her primary impairments.    Baseline Pt reports understanding and regular adherence to her HEP.    Time 3    Period Weeks    Status Achieved    Target Date 08/12/20               PT Long Term Goals - 08/26/20 1736       PT LONG TERM GOAL #1   Title Pt will demonstrate BIL cervical roation of 60 degrees  with 0/10 pain in order to drive without limitation.    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    Baseline Pt has occasional 3/10 pain when moving patients at work, improved from initial    Time 6    Period Weeks    Status Partially Met      PT LONG TERM GOAL #3   Title Pt will report ability to stand >30 minutes with 0/10 pain in order to cook and do dishes without difficulty.    Baseline Pt reports no pain with 30 minutes of standing.    Time 6    Period Weeks    Status Achieved      PT LONG TERM GOAL #4   Title Pt will demonstrate R cervical side bend equal to L with 0/10 pain in order to perform self care such as brushing her hair without difficulty.    Baseline R and L cervical side bend = 40 degrees 5/10 pain    Time 6    Period Weeks    Status Partially Met                   Plan - 08/26/20 1810     Clinical Impression Statement Upon reassessment, the pt  demonstrates improvement in baseline pain levels, reported functional pain levels, BIL cervical rotation AROM, and R cervical side bend AROM. She has met or mostly met all of her goals from rehab. The pt reports feeling independent in her HEP to manage her symptoms on her own and adds that she is ready to be disacharged at this time. Due to these reasons, the pt is discharged from PT.    Personal Factors and Comorbidities Comorbidity 1    Comorbidities HTN    Examination-Activity Limitations Lift;Stairs;Carry;Reach Overhead;Stand    Examination-Participation Restrictions Cleaning;Laundry;Occupation    Stability/Clinical Decision Making Stable/Uncomplicated    Clinical Decision Making Low    PT Next Visit Plan Pt is discharged from PT at this time    PT [place] [phone]    Consulted and Agree with Plan of Care Patient             Patient will benefit from skilled therapeutic intervention in order to improve the following deficits and impairments:  Decreased range of motion, Hypomobility, Postural dysfunction, Impaired flexibility, Pain  Visit Diagnosis: Cervicalgia  Pain in thoracic spine     Problem List Patient Active Problem List   Diagnosis Date Noted   Back pain 06/24/2020   Neck pain 06/24/2020   Symptomatic mammary hypertrophy 06/24/2020   Chest discomfort 08/09/2017   Bruit of left carotid artery 06/28/2017   Essential hypertension 06/22/2017   Leukocytosis 06/22/2017   AKI (acute kidney injury) ([place]) 06/22/2017   Dyspnea 06/22/2017   Syncope 06/21/2017   Benign neoplasm of skin of face 03/07/2017   Left shoulder pain 08/11/2016   Benign skin lesion 05/24/2015   Rosacea 05/24/2015   Attention deficit disorder of adult 08/02/2014   Depression 08/02/2014   Dyshidrotic eczema 08/02/2014   Migraines 08/02/2014   Overweight (BMI 25.0-29.9) 08/02/2014   Symptomatic menopausal or female climacteric states 08/02/2014  [place], [place], [address] Phone: [phone]   Fax:  [phone]  Name: Cheryl Mayer MRN: [id number] Date of Birth: April 26, 1965  PHYSICAL THERAPY DISCHARGE SUMMARY  Visits from Start of Care: 6  Current functional level related to goals / functional outcomes: Pt has met or partially met all of her stated rehab goals.   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 Patient goals were partially met. Patient is being discharged due to being pleased with the current functional level.  Vanessa Liberty, PT, DPT 08/26/20 6:16 PM

## 2020-09-09 ENCOUNTER — Encounter: Payer: Self-pay | Admitting: Physician Assistant

## 2020-09-09 ENCOUNTER — Ambulatory Visit (INDEPENDENT_AMBULATORY_CARE_PROVIDER_SITE_OTHER): Payer: BC Managed Care – PPO | Admitting: Physician Assistant

## 2020-09-09 ENCOUNTER — Other Ambulatory Visit: Payer: Self-pay

## 2020-09-09 VITALS — BP 161/93 | HR 70 | Ht 64.0 in | Wt 183.0 lb

## 2020-09-09 DIAGNOSIS — N62 Hypertrophy of breast: Secondary | ICD-10-CM

## 2020-09-09 NOTE — Progress Notes (Signed)
   Subjective:     Patient ID: Cheryl Mayer, female    DOB: [birth year], 55 y.o.   MRN: [id number]  Chief Complaint  Patient presents with   Follow-up    HPI: The patient is a 55 y.o. female who presents to clinic for follow-up for macromastia.  Patient was last seen via video encounter by Dr. Marla Roe.  She was noted to have right-sided sternal to nipple distance of 28 cm and left-sided sternal to nipple distance of 30 cm.  She is a D cup.  She had already begun physical therapy.  They estimated 600 g removal of breast tissue from each side.  I reviewed her chart and mammogram obtained 08/13/2020 was without evidence of malignancy.  On my examination, patient denies any changes in her health and wellbeing.  She continues to experience upper back and neck discomfort despite 6 weeks physical therapy.  She reports that she works in [place] and is a physically demanding position.  Patient would very much like to proceed with surgical intervention.    She is uncertain as to whether or not she has lost weight, but she has been exercising regularly and eating well.  She understands that she needs to mitigate sugars and increase proteins.  Her only question was whether or not we would be able to address any fatty pouches in axillary regions with her breast reduction surgery.   Review of Systems  Constitutional:  Negative for fever.  Respiratory:  Negative for shortness of breath.   Musculoskeletal:  Positive for neck pain.  Hematological:  Does not bruise/bleed easily.    Objective:   Vital Signs BP (!) 161/93 (BP Location: Left Arm, Patient Position: Sitting, Cuff Size: Normal)   Pulse 70   Ht '5\' 4"'$  (1.626 m)   Wt 183 lb (83 kg)   LMP  (LMP Unknown)   SpO2 97%   BMI 31.41 kg/m  Vital Signs and Nursing Note Reviewed Physical Exam Constitutional:      Appearance: Normal appearance.  Eyes:     Extraocular Movements: Extraocular movements intact.  Pulmonary:     Effort:  Pulmonary effort is normal.  Musculoskeletal:        General: Normal range of motion.  Neurological:     Mental Status: She is alert and oriented to person, place, and time.     Gait: Gait normal.      Assessment/Plan:     ICD-10-CM   1. Symptomatic mammary hypertrophy  N62       Patient is strong candidate for breast reduction surgery given her symptomatic macromastia. She has continued upper back and neck discomfort despite physical therapy intervention.   Will notify our surgical coordinator and try to schedule her for later this year.  Addressed her questions here in clinic and recommend that she call us back with any additional questions or concerns.   She will receive a phone call from [place] in a few weeks regarding scheduling for surgical intervention as well as determination of in-clinic pre-operative history and physical exam.   Krista Blue, PA-C 09/09/2020, 1:36 PM

## 2020-09-25 ENCOUNTER — Other Ambulatory Visit: Payer: Self-pay

## 2020-09-25 ENCOUNTER — Ambulatory Visit (INDEPENDENT_AMBULATORY_CARE_PROVIDER_SITE_OTHER): Payer: BC Managed Care – PPO

## 2020-09-25 VITALS — BP 161/93 | Ht 64.0 in | Wt 183.0 lb

## 2020-09-25 DIAGNOSIS — N62 Hypertrophy of breast: Secondary | ICD-10-CM | POA: Diagnosis not present

## 2020-09-27 NOTE — Progress Notes (Signed)
Patient in office today for additional photos  for insurance pre-approval submission. Per patient she has not had any changes in her overall health- & she continues to c/o neck/back & shoulder pain- & continues to have stress/strain from bra straps d/t heaviness of her breasts. Photos taken & entered into chart -of shoulder indentations & red areas from the bra straps.

## 2020-09-27 NOTE — Patient Instructions (Signed)
Patient understands that photos will be sent to insurance for review.

## 2020-10-02 ENCOUNTER — Ambulatory Visit: Payer: BC Managed Care – PPO | Admitting: Surgical

## 2020-10-02 ENCOUNTER — Encounter (INDEPENDENT_AMBULATORY_CARE_PROVIDER_SITE_OTHER): Payer: Self-pay

## 2020-10-08 ENCOUNTER — Encounter: Payer: Self-pay | Admitting: Plastic Surgery

## 2020-10-09 ENCOUNTER — Telehealth: Payer: Self-pay | Admitting: Pulmonary Disease

## 2020-10-09 NOTE — Telephone Encounter (Signed)
See msg

## 2020-10-09 NOTE — Telephone Encounter (Signed)
Called and spoke with pt who states she received a bill from insurance on 09/17/20 stating that insurance was denying coverage of the sleep study.  Pt said that the bill was for over $500 which she said insurance was not wanting to pay due to stating that another sleep study was needing to be done when pt already had a sleep study.  Stated to pt that we have not ordered another sleep study to be performed since she had the one sleep study which showed that she did have sleep apnea. Stated to her that we are waiting now for her to be started on cpap therapy and pt verbalized understanding. Stated to pt that she needed to contact billing dept and she verbalized understanding. Nothing further needed.

## 2020-10-16 ENCOUNTER — Telehealth: Payer: Self-pay | Admitting: Pulmonary Disease

## 2020-10-16 NOTE — Telephone Encounter (Signed)
Initial sleep study was sent with wrong CTT code and when the new one was sent, [employer] closed it believing it was a duplicate. The next steps will be to appeal it. Provider needs to call [employer]: [phone], follow the promps and talk to rep to schedule appeal. Order number: [id number] (Preauthorization reference number)

## 2020-10-17 NOTE — Telephone Encounter (Signed)
I will need to speak to [name] about this next week when she is back in the office then we will get it straight Cheryl Mayer

## 2020-11-05 NOTE — Telephone Encounter (Signed)
Sorry Kathlee Nations I been out of the office I will take care of this thanks

## 2020-12-15 NOTE — Progress Notes (Signed)
Patient ID: Cheryl Mayer, female    DOB: December 25, 1965, 55 y.o.   MRN: [id number]  Chief Complaint  Patient presents with   Pre-op Exam      ICD-10-CM   1. Symptomatic mammary hypertrophy  N62        History of Present Illness: Cheryl Mayer is a 55 y.o.  female  with a history of macromastia.  She presents for preoperative evaluation for upcoming procedure, bilateral breast reduction with liposuction, scheduled for 01/08/2021 with Dr. Marla Roe.  The patient has not had problems with anesthesia.  Maternal grandmother had breast cancer in her 55s, no other family or personal history of breast cancer.  Mammogram earlier this year was negative.  She is no longer taking phentermine or Qsymia.  She is not requiring any medical management for her ADD.  She denies any personal or family history of blood clots or clotting disorder.  She also denies any personal history of cancer.  No allergies to tape or Steri-Strips.  She believes that she is a 68 D cup and reports that she would like to be a B cup.  She does not smoke and she is not diabetic.  She is receiving Mounjaro injections once weekly for weight loss.  Summary of Previous Visit: Patient was last seen here in clinic on 09/09/2020 after completing physical therapy.  She continues to endorse upper back and neck discomfort.  During initial consult she was noted to have STN of 28 cm on right side, 30 cm on left side.    Job: Mammography, physically demanding. 3 weeks.    PMH Significant for: Symptomatic mammary hypertrophy, HTN, ADHD.   Past Medical History: Allergies: Allergies  Allergen Reactions   Desvenlafaxine Other (See Comments)    Unknown    Current Medications:  Current Outpatient Medications:    acetaminophen (TYLENOL) 325 MG tablet, Take 650 mg by mouth every 6 (six) hours as needed for mild pain. , Disp: , Rfl:    amLODipine (NORVASC) 5 MG tablet, Take by mouth., Disp: , Rfl:    aspirin-acetaminophen-caffeine  (EXCEDRIN MIGRAINE) 250-250-65 MG tablet, Take 1 tablet by mouth every 6 (six) hours as needed for headache., Disp: , Rfl:    cephALEXin (KEFLEX) 500 MG capsule, Take 1 capsule (500 mg total) by mouth 4 (four) times daily for 3 days., Disp: 12 capsule, Rfl: 0   HYDROcodone-acetaminophen (NORCO) 5-325 MG tablet, Take 1 tablet by mouth every 6 (six) hours as needed for up to 5 days for severe pain., Disp: 20 tablet, Rfl: 0   MOUNJARO 10 MG/0.5ML Pen, SMARTSIG:10 Milligram(s) SUB-Q Once a Week, Disp: , Rfl:    ondansetron (ZOFRAN ODT) 4 MG disintegrating tablet, Take 1 tablet (4 mg total) by mouth every 8 (eight) hours as needed for nausea or vomiting., Disp: 20 tablet, Rfl: 0  Past Medical Problems: Past Medical History:  Diagnosis Date   Anemia    Depression    Gestational diabetes    HTN (hypertension)    Migraines    Obesity    Pain in female genitalia on intercourse    Syncope     Past Surgical History: Past Surgical History:  Procedure Laterality Date   DILATION AND CURETTAGE OF UTERUS     x2   LAPAROSCOPIC VAGINAL HYSTERECTOMY      Social History: Social History   Socioeconomic History   Marital status: Married    Spouse name: Not on file   Number of children: Not on  file   Years of education: Not on file   Highest education level: Not on file  Occupational History   Not on file  Tobacco Use   Smoking status: Never   Smokeless tobacco: Never  Vaping Use   Vaping Use: Never used  Substance and Sexual Activity   Alcohol use: No   Drug use: Never   Sexual activity: Yes    Birth control/protection: Surgical    Comment: Hysterectomy -- ovaries remain  Other Topics Concern   Not on file  Social History Narrative   Not on file   Social Determinants of Health   Financial Resource Strain: Not on file  Food Insecurity: Not on file  Transportation Needs: Not on file  Physical Activity: Not on file  Stress: Not on file  Social Connections: Not on file  Intimate  Partner Violence: Not on file    Family History: Family History  Problem Relation Age of Onset   Breast cancer Paternal Grandmother 48   Diabetes Mother    Heart attack Mother    Stroke Mother    Heart attack Father    Diabetes Father    CAD Father    Diabetes Sister    Breast cancer Paternal Aunt     Review of Systems: ROS No recent fevers, chills, infection, rash.  Physical Exam: Vital Signs BP 132/82 (BP Location: Left Arm, Patient Position: Sitting, Cuff Size: Normal)   Pulse 88   Ht 5\' 4"  (1.626 m)   Wt 165 lb 3.2 oz (74.9 kg)   LMP  (LMP Unknown)   SpO2 98%   BMI 28.36 kg/m   Physical Exam Constitutional:      General: Not in acute distress.    Appearance: Normal appearance. Not ill-appearing.  HENT:     Head: Normocephalic and atraumatic.  Eyes:     Pupils: Pupils are equal, round Neck:     Musculoskeletal: Normal range of motion.  Cardiovascular:     Rate and Rhythm: Normal rate    Pulses: Normal pulses.  Pulmonary:     Effort: Pulmonary effort is normal. No respiratory distress.  Abdominal:     General: Abdomen is flat. There is no distension.  Musculoskeletal: Normal range of motion.  Ambulatory. Skin:    General: Skin is warm and dry.     Findings: No erythema or rash.  Neurological:     General: No focal deficit present.     Mental Status: Alert and oriented to person, place, and time. Mental status is at baseline.     Motor: No weakness.  Psychiatric:        Mood and Affect: Mood normal.        Behavior: Behavior normal.    Assessment/Plan: The patient is scheduled for bilateral breast reduction with liposuction 01/08/2021 with Dr. Marla Roe.  Risks, benefits, and alternatives of procedure discussed, questions answered and consent obtained.    Smoking Status: Never. Last Mammogram: 08/2020; Results: Negative, BI-RADS Category 1.  Caprini Score: 4; Risk Factors include: Age, BMI greater than 25, and length of planned surgery.  Recommendation for mechanical prophylaxis. Encourage early ambulation.   Pictures obtained: 09/25/2020.  Post-op Rx sent to pharmacy: Norco, Zofran, Keflex.  Patient was provided with the General Surgical Risk consent document and Pain Medication Agreement prior to their appointment.  They had adequate time to read through the risk consent documents and Pain Medication Agreement. We also discussed them in person together during this preop appointment. All of their questions  were answered to their satisfaction.  Recommended calling if they have any further questions.  Risk consent form and Pain Medication Agreement to be scanned into patient's chart.  The risk that can be encountered with breast reduction were discussed and include the following but not limited to these:  Breast asymmetry, fluid accumulation, firmness of the breast, inability to breast feed, loss of nipple or areola, skin loss, decrease or no nipple sensation, fat necrosis of the breast tissue, bleeding, infection, healing delay.  There are risks of anesthesia, changes to skin sensation and injury to nerves or blood vessels.  The muscle can be temporarily or permanently injured.  You may have an allergic reaction to tape, suture, glue, blood products which can result in skin discoloration, swelling, pain, skin lesions, poor healing.  Any of these can lead to the need for revisonal surgery or stage procedures.  A reduction has potential to interfere with diagnostic procedures.  Nipple or breast piercing can increase risks of infection.  This procedure is best done when the breast is fully developed.  Changes in the breast will continue to occur over time.  Pregnancy can alter the outcomes of previous breast reduction surgery, weight gain and weigh loss can also effect the long term appearance.   The risks that can be encountered with and after liposuction were discussed and include the following but no limited to these:  Asymmetry, fluid  accumulation, firmness of the area, fat necrosis with death of fat tissue, bleeding, infection, delayed healing, anesthesia risks, skin sensation changes, injury to structures including nerves, blood vessels, and muscles which may be temporary or permanent, allergies to tape, suture materials and glues, blood products, topical preparations or injected agents, skin and contour irregularities, skin discoloration and swelling, deep vein thrombosis, cardiac and pulmonary complications, pain, which may persist, persistent pain, recurrence of the lesion, poor healing of the incision, possible need for revisional surgery or staged procedures. Thiere can also be persistent swelling, poor wound healing, rippling or loose skin, worsening of cellulite, swelling, and thermal burn or heat injury from ultrasound with the ultrasound-assisted lipoplasty technique. Any change in weight fluctuations can alter the outcome.    Electronically signed by: Krista Blue, PA-C 12/16/2020 2:17 PM

## 2020-12-16 ENCOUNTER — Encounter: Payer: Self-pay | Admitting: Pulmonary Disease

## 2020-12-16 ENCOUNTER — Encounter: Payer: Self-pay | Admitting: Physician Assistant

## 2020-12-16 ENCOUNTER — Other Ambulatory Visit: Payer: Self-pay

## 2020-12-16 ENCOUNTER — Ambulatory Visit (INDEPENDENT_AMBULATORY_CARE_PROVIDER_SITE_OTHER): Payer: BC Managed Care – PPO | Admitting: Pulmonary Disease

## 2020-12-16 ENCOUNTER — Ambulatory Visit (INDEPENDENT_AMBULATORY_CARE_PROVIDER_SITE_OTHER): Payer: BC Managed Care – PPO | Admitting: Physician Assistant

## 2020-12-16 VITALS — BP 118/78 | HR 85 | Temp 98.2°F | Ht 64.0 in | Wt 166.0 lb

## 2020-12-16 VITALS — BP 132/82 | HR 88 | Ht 64.0 in | Wt 165.2 lb

## 2020-12-16 DIAGNOSIS — Z9989 Dependence on other enabling machines and devices: Secondary | ICD-10-CM | POA: Diagnosis not present

## 2020-12-16 DIAGNOSIS — N62 Hypertrophy of breast: Secondary | ICD-10-CM

## 2020-12-16 DIAGNOSIS — G4733 Obstructive sleep apnea (adult) (pediatric): Secondary | ICD-10-CM

## 2020-12-16 MED ORDER — ONDANSETRON 4 MG PO TBDP: 4.0000 mg | ORAL_TABLET | Freq: Three times a day (TID) | ORAL | 0 refills | Status: DC | PRN

## 2020-12-16 MED ORDER — HYDROCODONE-ACETAMINOPHEN 5-325 MG PO TABS: 1.0000 | ORAL_TABLET | Freq: Four times a day (QID) | ORAL | 0 refills | Status: AC | PRN

## 2020-12-16 MED ORDER — CEPHALEXIN 500 MG PO CAPS: 500.0000 mg | ORAL_CAPSULE | Freq: Four times a day (QID) | ORAL | 0 refills | Status: AC

## 2020-12-16 NOTE — Patient Instructions (Signed)
Continue using his CPAP on a nightly basis  Continue weight loss efforts   Nasal steroid-Nasonex, Flonase or similar may help with nasal congestion  Call with any significant concerns    Follow-up in 6 months

## 2020-12-16 NOTE — Progress Notes (Signed)
Cheryl Mayer    [id number]    Jan 12, 1966  Primary Care Physician:Wilson, Jama Flavors, MD  Referring Physician: Percell Belt, DO [address]  [place] [address]  Chief complaint:   Patient being seen for snoring, uncontrolled hypertension Diagnosed with moderate obstructive sleep apnea  HPI:  Has been trying to get used to using CPAP on a nightly basis, tolerating CPAP well  Feels better with CPAP  CPAP compliance shows excellent compliance blood pressure better controlled Uncontrolled hypertension  History of snoring, no witnessed apneas  Usually goes to bed between 10 and 11 PM Takes about 10 to 20 minutes to fall asleep About 3-4 awakenings Final wake up time about 5:30 AM  Blood pressure has been more difficult to control recently and she is woken up from sleep with headaches  Admits to dryness of her mouth Admits to headaches Memory is fine Little problem with home sleep focus recently  Dad did snore  Sleepy and tired during the day but never takes naps  Outpatient Encounter Medications as of 04/14/2020  Medication Sig   acetaminophen (TYLENOL) 325 MG tablet Take 650 mg by mouth every 6 (six) hours as needed for mild pain.    amLODipine (NORVASC) 5 MG tablet Take by mouth.   aspirin-acetaminophen-caffeine (EXCEDRIN MIGRAINE) 250-250-65 MG tablet Take 1 tablet by mouth every 6 (six) hours as needed for headache.   cyanocobalamin (,VITAMIN B-12,) 1000 MCG/ML injection Inject 1,000 mcg into the muscle every 30 (thirty) days.   NORCO 5-325 MG tablet Take 1-2 tablets by mouth every 6 (six) hours as needed. (Patient not taking: Reported on 04/14/2020)   phentermine (ADIPEX-P) 37.5 MG tablet Take 18.75-37.5 mg by mouth See admin instructions. 1/2 to one tab daily (Patient not taking: Reported on 04/14/2020)   topiramate (TOPAMAX) 25 MG tablet Take 25 mg by mouth daily. (Patient not taking: Reported on 04/14/2020)   No facility-administered  encounter medications on file as of 04/14/2020.    Allergies as of 04/14/2020   (No Known Allergies)    Past Medical History:  Diagnosis Date   Anemia    Depression    Gestational diabetes    HTN (hypertension)    Migraines    Obesity    Pain in female genitalia on intercourse    Syncope     Past Surgical History:  Procedure Laterality Date   DILATION AND CURETTAGE OF UTERUS     x2   LAPAROSCOPIC VAGINAL HYSTERECTOMY      Family History  Problem Relation Age of Onset   Breast cancer Paternal Grandmother 35   Diabetes Mother    Heart attack Mother    Stroke Mother    Heart attack Father    Diabetes Father    CAD Father    Diabetes Sister    Breast cancer Paternal Aunt     Social History   Socioeconomic History   Marital status: Married    Spouse name: Not on file   Number of children: Not on file   Years of education: Not on file   Highest education level: Not on file  Occupational History   Not on file  Tobacco Use   Smoking status: Never Smoker   Smokeless tobacco: Never Used  Vaping Use   Vaping Use: Never used  Substance and Sexual Activity   Alcohol use: No   Drug use: Never   Sexual activity: Yes    Birth control/protection:  Surgical    Comment: Hysterectomy -- ovaries remain  Other Topics Concern   Not on file  Social History Narrative   Not on file   Social Determinants of Health   Financial Resource Strain: Not on file  Food Insecurity: Not on file  Transportation Needs: Not on file  Physical Activity: Not on file  Stress: Not on file  Social Connections: Not on file  Intimate Partner Violence: Not on file    Review of Systems  Constitutional:  Positive for fatigue.  Respiratory:  Positive for apnea.   Psychiatric/Behavioral:  Positive for sleep disturbance.    Vitals:   04/14/20 1530  BP: (!) 146/92  Pulse: 89  Temp: 98 F (36.7 C)  SpO2: 97%     Physical Exam Vitals and nursing note reviewed.  Constitutional:       Appearance: She is obese.  HENT:     Right Ear: Tympanic membrane normal.     Mouth/Throat:     Mouth: Mucous membranes are moist.     Comments: Mallampati 3, crowded oropharynx Eyes:     General:        Right eye: No discharge.        Left eye: No discharge.     Pupils: Pupils are equal, round, and reactive to light.  Cardiovascular:     Heart sounds: No murmur heard.   No friction rub.  Pulmonary:     Effort: Pulmonary effort is normal. No respiratory distress.     Breath sounds: Normal breath sounds. No stridor. No wheezing or rhonchi.  Neurological:     Mental Status: She is alert.   Results of the Epworth flowsheet 04/14/2020  Sitting and reading 1  Watching TV 2  Sitting, inactive in a public place (e.g. a theatre or a meeting) 0  As a passenger in a car for an hour without a break 3  Lying down to rest in the afternoon when circumstances permit 0  Sitting and talking to someone 0  Sitting quietly after a lunch without alcohol 0  In a car, while stopped for a few minutes in traffic 0  Total score 6    Data Reviewed: No previous study available sleep study showed moderate obstructive sleep apnea  CPAP compliance shows 93% compliance Average use of 5 hours Machine set between 5 and 15 95 percentile pressure of 10 AHI of 3.5  Assessment:  Moderate obstructive sleep apnea -Adequately treated with CPAP therapy  Benefit from CPAP use  Has managed to lose about 15 pounds since last visit -Encouraged to continue weight loss efforts -Continue CPAP use  Plan/Recommendations: Continue CPAP use  Follow-up in 6 months  Encouraged to call with any significant concerns   Sherrilyn Rist MD  Pulmonary and Critical Care 04/14/2020, 3:59 PM  CC: Lazoff, Shawn P, DO

## 2021-01-08 ENCOUNTER — Other Ambulatory Visit: Payer: Self-pay | Admitting: Plastic Surgery

## 2021-01-08 HISTORY — PX: REDUCTION MAMMAPLASTY: SUR839

## 2021-01-16 ENCOUNTER — Ambulatory Visit (INDEPENDENT_AMBULATORY_CARE_PROVIDER_SITE_OTHER): Payer: BC Managed Care – PPO | Admitting: Plastic Surgery

## 2021-01-16 ENCOUNTER — Other Ambulatory Visit: Payer: Self-pay

## 2021-01-16 ENCOUNTER — Encounter: Payer: Self-pay | Admitting: Plastic Surgery

## 2021-01-16 DIAGNOSIS — N951 Menopausal and female climacteric states: Secondary | ICD-10-CM

## 2021-01-16 NOTE — Progress Notes (Signed)
The patient is a 55 year old female here for follow-up after undergoing a breast reduction.  Her surgery was last week and her dressings are in place.  There is no sign of infection.  She states that her pain has been very minimal.  She is only been using Motrin.  Her bowels are moving.  She does not have any questions.  She can go into a sports bra.  Be sure it is not too tight.  Shower is ok.  Follow up next week.

## 2021-01-22 ENCOUNTER — Telehealth: Payer: Self-pay | Admitting: Plastic Surgery

## 2021-01-22 NOTE — Telephone Encounter (Signed)
Patient called and stated last time she was here Dr.Dillingham placed honeycomb bandages. (12/16) Monday, patient showered in the evening and noticed a pinkish liquid draining from bandage and soaked bra. She figured it was just from showering. (12/19) This morning she woke up with her t-shirt wet and when she lifts her arms, the pinkish liquid drips from her breast area. (12/22)  Dr. Donna Christen has availability tomorrow if you feel she needs to come in and be seen.  Please follow up with patient and update front staff to let them know if they should contact patient to schedule appointment.

## 2021-01-23 ENCOUNTER — Ambulatory Visit (INDEPENDENT_AMBULATORY_CARE_PROVIDER_SITE_OTHER): Payer: BC Managed Care – PPO | Admitting: Physician Assistant

## 2021-01-23 ENCOUNTER — Other Ambulatory Visit: Payer: Self-pay

## 2021-01-23 DIAGNOSIS — Z9889 Other specified postprocedural states: Secondary | ICD-10-CM

## 2021-01-23 NOTE — Progress Notes (Signed)
Patient is a 55 year old female with PMH of macromastia s/p bilateral breast reduction liposuction performed 01/08/2021 with Dr. Marla Roe who presents to clinic for postoperative follow-up.  She was last seen here in clinic on 01/16/2021.  At that time, dressings were all intact and pain symptoms were minimal.  Plan was for transition into a sports bra.  No concerning findings on exam.  Today, patient reports that there is a mild to moderate amount of pink drainage coming from her right breast.  She states that it began over the course of the past couple of days.  She denies any significant pain symptoms fevers or chills, redness, or other symptoms.  She continues to wear compressive garments regularly.  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  She already has another appointment scheduled next week with our clinic.  Physical exam today is entirely reassuring.  Despite the reported drainage to right breast, no obvious wounds appreciated.  Scattered suture heads were snipped.  No swelling or obvious subcutaneous fluid collections otherwise amenable to aspiration.  No areas of erythema.  Left breast vertical incision does have a small wound, approximately 0.5 cm.  Patient denies any drainage from that breast.  Recommending daily Vaseline over incisions followed by ABD pads and secured with her compressive garment (alternating between compressive bra and binder).  Provided patient with a prescription to [facility], as well.  Picture(s) obtained of the patient and placed in the chart were with the patient's or guardian's permission.

## 2021-01-30 ENCOUNTER — Ambulatory Visit (INDEPENDENT_AMBULATORY_CARE_PROVIDER_SITE_OTHER): Payer: BC Managed Care – PPO | Admitting: Surgical

## 2021-01-30 ENCOUNTER — Encounter: Payer: BC Managed Care – PPO | Admitting: Physician Assistant

## 2021-01-30 ENCOUNTER — Other Ambulatory Visit: Payer: Self-pay

## 2021-01-30 DIAGNOSIS — Z9889 Other specified postprocedural states: Secondary | ICD-10-CM

## 2021-01-30 NOTE — Progress Notes (Signed)
55 year old female here for follow-up after bilateral breast reduction with liposuction on 01/08/2021 with Dr. Marla Roe.  She reports overall she is doing well, she previously had some drainage from the right breast but this has resolved.  She reports that she is now noticing some drainage from the left breast.  She is not having any infectious symptoms.  Chaperone present on exam On exam bilateral NAC's are viable, right breast incisions are intact and healing well.  She has a left breast wound at the T-junction that is approximately 2 x 2 mm.  There is no surrounding erythema or cellulitic changes.  I was able to express some serosanguineous drainage with light pressure.  No purulence is noted.  No foul odor.  Recommend Vaseline and gauze to the left breast incision, recommend continue her compressive garments 24/7 for the next 3 weeks.  Recommend avoiding strenuous activities.  She is planning to return to work next week.  I completed some forms for her with restrictions listed.  Recommend following up in 4 weeks for reevaluation.  I do not see any signs of infection on exam.

## 2021-03-03 ENCOUNTER — Other Ambulatory Visit: Payer: Self-pay

## 2021-03-03 ENCOUNTER — Ambulatory Visit (INDEPENDENT_AMBULATORY_CARE_PROVIDER_SITE_OTHER): Payer: BC Managed Care – PPO | Admitting: Surgical

## 2021-03-03 DIAGNOSIS — Z9889 Other specified postprocedural states: Secondary | ICD-10-CM

## 2021-03-03 NOTE — Progress Notes (Signed)
Patient is a 56 year old female here for follow-up after bilateral breast reduction with Dr. Marla Roe.  She reports she is overall doing really well.  She is very pleased.  She has a small area of drainage on the left breast at the T-junction.  She has a few sutures that are protruding through the skin and causing some irritation.  Chaperone present on exam On exam bilateral NAC's are viable, bilateral breast incisions are intact and healing well.  She has a small scab at the T-junction of the right left breast.  She has a few small sutures protruding through the skin and on bilateral breast inframammary fold incisions.  No erythema or subcutaneous fluid collections.  Recommend continue her compressive garments throughout the day, can transition to wearing a normal bra in the next few weeks.  Recommend following up as needed.  No signs of infection on exam.  Call with questions or concerns.

## 2021-09-09 ENCOUNTER — Encounter (INDEPENDENT_AMBULATORY_CARE_PROVIDER_SITE_OTHER): Payer: Self-pay

## 2021-09-29 ENCOUNTER — Other Ambulatory Visit: Payer: Self-pay | Admitting: Internal Medicine

## 2021-09-29 ENCOUNTER — Other Ambulatory Visit: Payer: Self-pay | Admitting: Family Medicine

## 2021-09-29 ENCOUNTER — Ambulatory Visit: Payer: BC Managed Care – PPO

## 2021-09-29 DIAGNOSIS — Z1231 Encounter for screening mammogram for malignant neoplasm of breast: Secondary | ICD-10-CM

## 2021-09-29 DIAGNOSIS — R921 Mammographic calcification found on diagnostic imaging of breast: Secondary | ICD-10-CM

## 2021-10-06 ENCOUNTER — Inpatient Hospital Stay: Admission: RE | Admit: 2021-10-06 | Payer: BC Managed Care – PPO | Source: Ambulatory Visit

## 2021-10-16 ENCOUNTER — Inpatient Hospital Stay: Admission: RE | Admit: 2021-10-16 | Payer: BC Managed Care – PPO | Source: Ambulatory Visit

## 2021-10-23 ENCOUNTER — Ambulatory Visit
Admission: RE | Admit: 2021-10-23 | Discharge: 2021-10-23 | Disposition: A | Discharge status: Home / Self Care | Payer: BC Managed Care – PPO | Source: Ambulatory Visit | Attending: Family Medicine | Admitting: Family Medicine

## 2021-10-23 DIAGNOSIS — Z1231 Encounter for screening mammogram for malignant neoplasm of breast: Secondary | ICD-10-CM | POA: Diagnosis present

## 2022-02-17 ENCOUNTER — Telehealth: Payer: BC Managed Care – PPO | Admitting: Physician Assistant

## 2022-02-17 DIAGNOSIS — J019 Acute sinusitis, unspecified: Secondary | ICD-10-CM

## 2022-02-17 DIAGNOSIS — B9689 Other specified bacterial agents as the cause of diseases classified elsewhere: Secondary | ICD-10-CM | POA: Diagnosis not present

## 2022-02-17 MED ORDER — AMOXICILLIN-POT CLAVULANATE 875-125 MG PO TABS: 1.0000 | ORAL_TABLET | Freq: Two times a day (BID) | ORAL | 0 refills | Status: DC

## 2022-02-17 NOTE — Progress Notes (Signed)

## 2022-10-27 ENCOUNTER — Ambulatory Visit
Admission: RE | Admit: 2022-10-27 | Discharge: 2022-10-27 | Disposition: A | Discharge status: Home / Self Care | Payer: BC Managed Care – PPO | Source: Ambulatory Visit | Attending: Family Medicine | Admitting: Family Medicine

## 2022-10-27 ENCOUNTER — Other Ambulatory Visit: Payer: Self-pay | Admitting: Family Medicine

## 2022-10-27 DIAGNOSIS — Z1231 Encounter for screening mammogram for malignant neoplasm of breast: Secondary | ICD-10-CM | POA: Insufficient documentation

## 2023-01-21 IMAGING — MG MM DIGITAL SCREENING BILAT W/ TOMO AND CAD
8 series · 8 of 24 positions shown · non-contrast
Comparison: Previous exam(s).

CLINICAL DATA: Screening.

EXAM:
DIGITAL SCREENING BILATERAL MAMMOGRAM WITH TOMOSYNTHESIS AND CAD
TECHNIQUE: Bilateral screening digital craniocaudal and mediolateral oblique
mammograms were obtained. Bilateral screening digital breast
tomosynthesis was performed. The images were evaluated with
computer-aided detection.

[R MLO synth-2D]
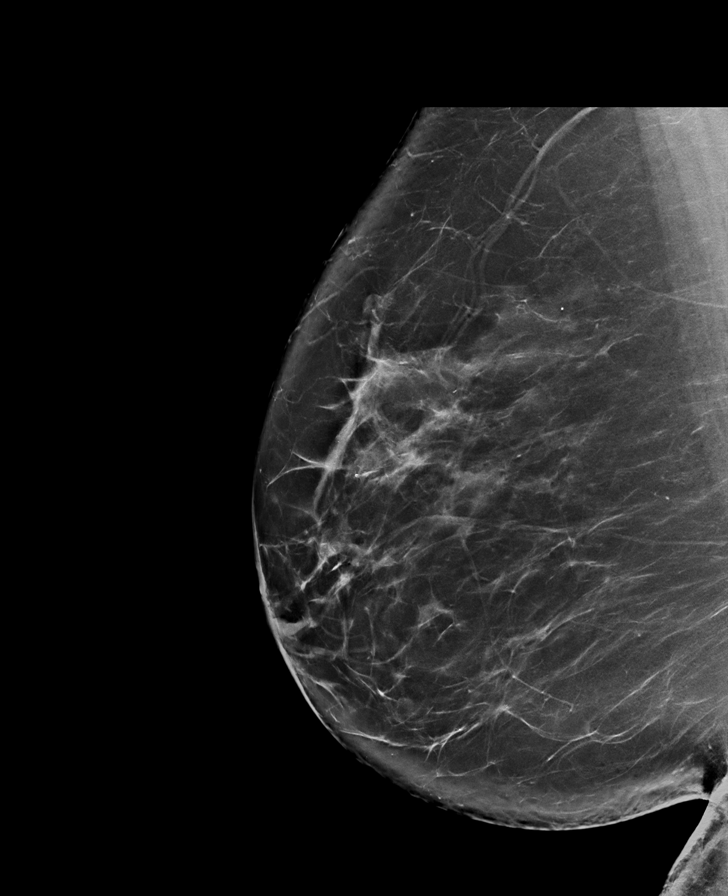

[L MLO synth-2D]
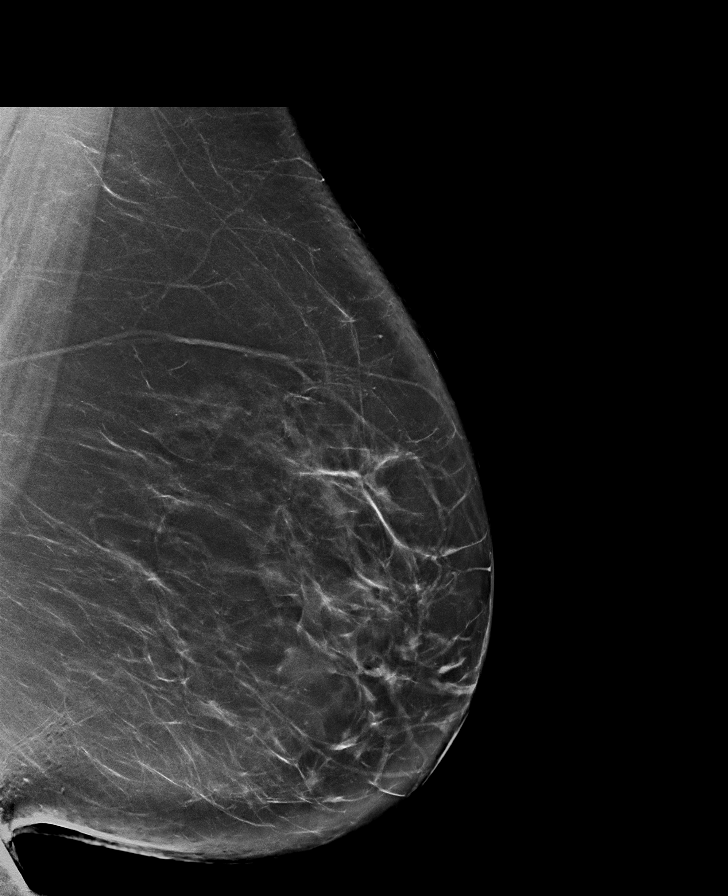

[L CC synth-2D]
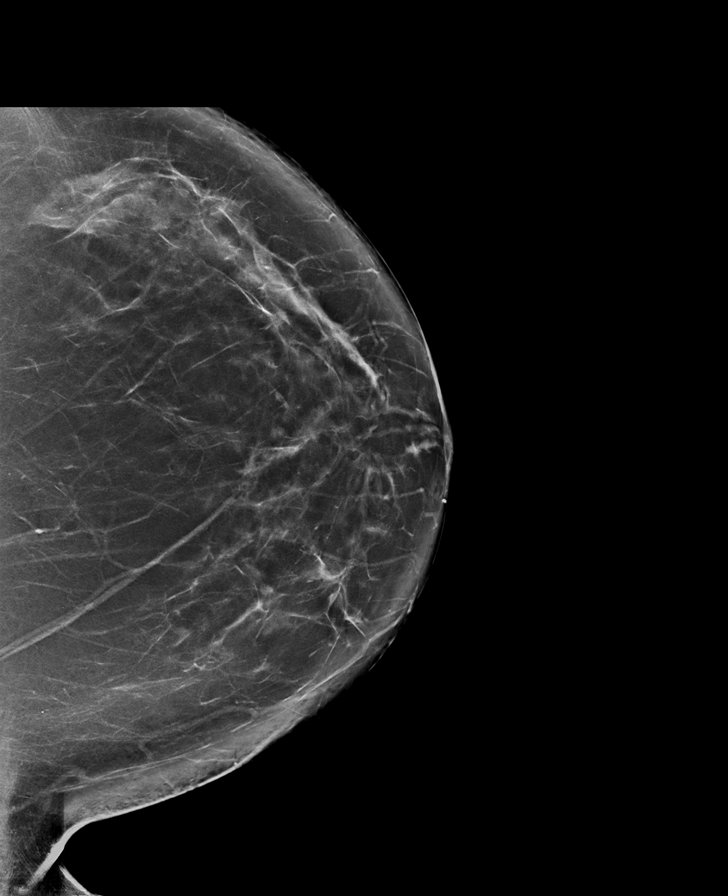

[R CC synth-2D]
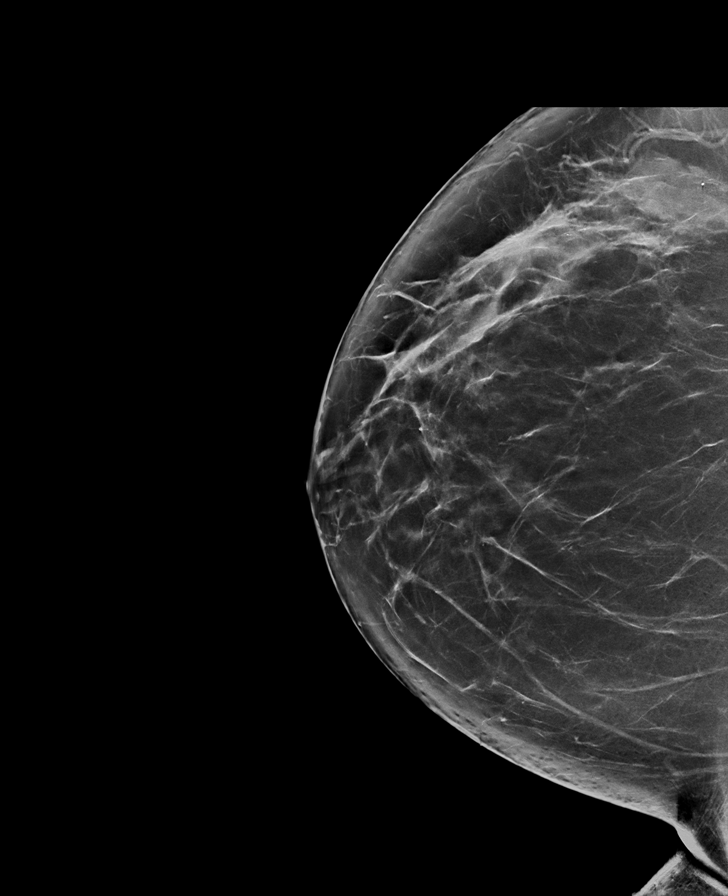

[L CC tomo · tomo slice 48/95.0]
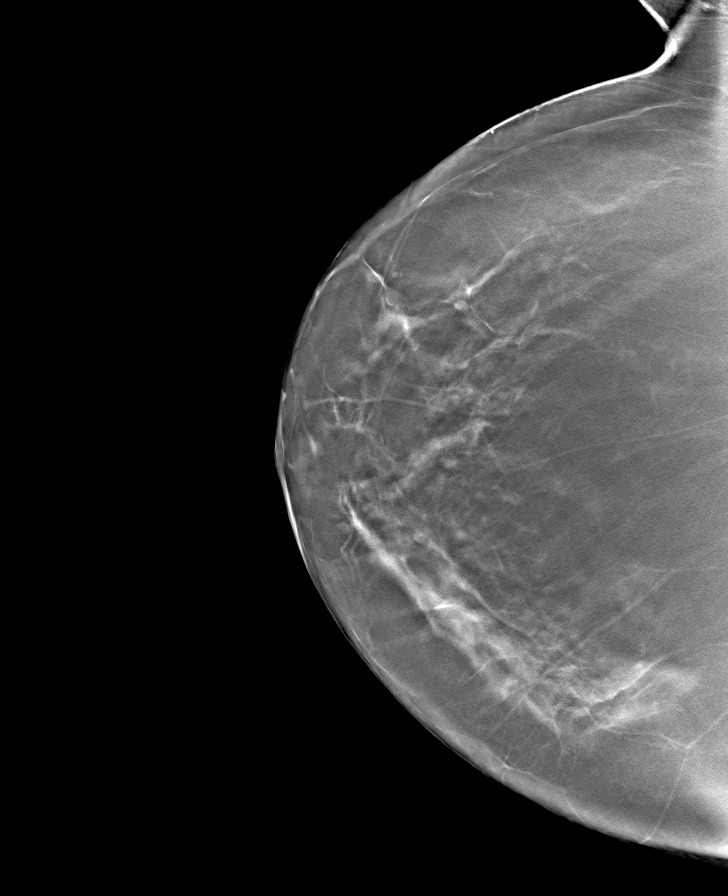

[R MLO tomo · tomo slice 47/93.0]
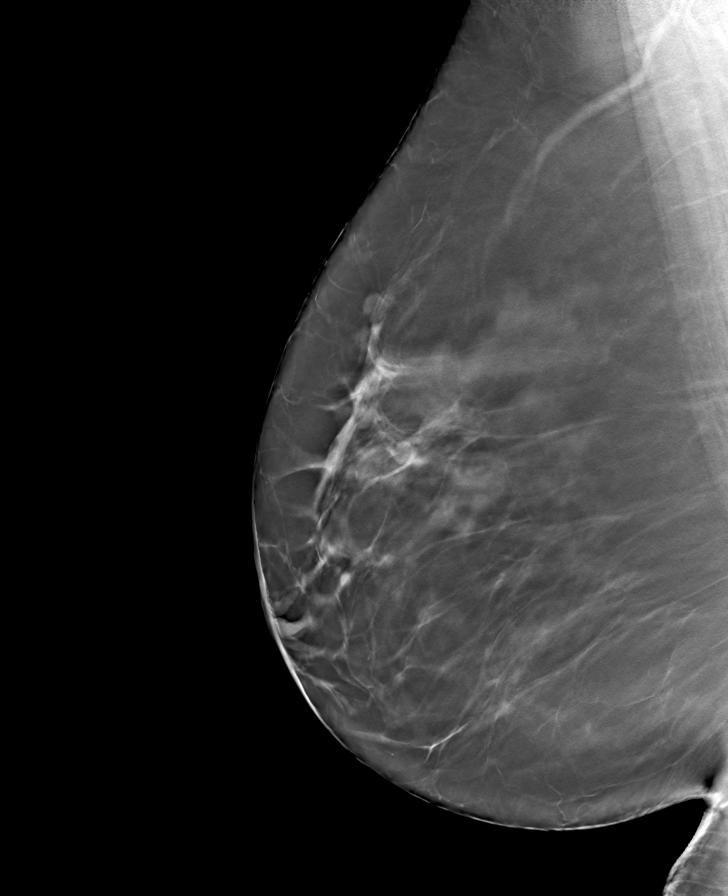

[R CC tomo · tomo slice 45/89.0]
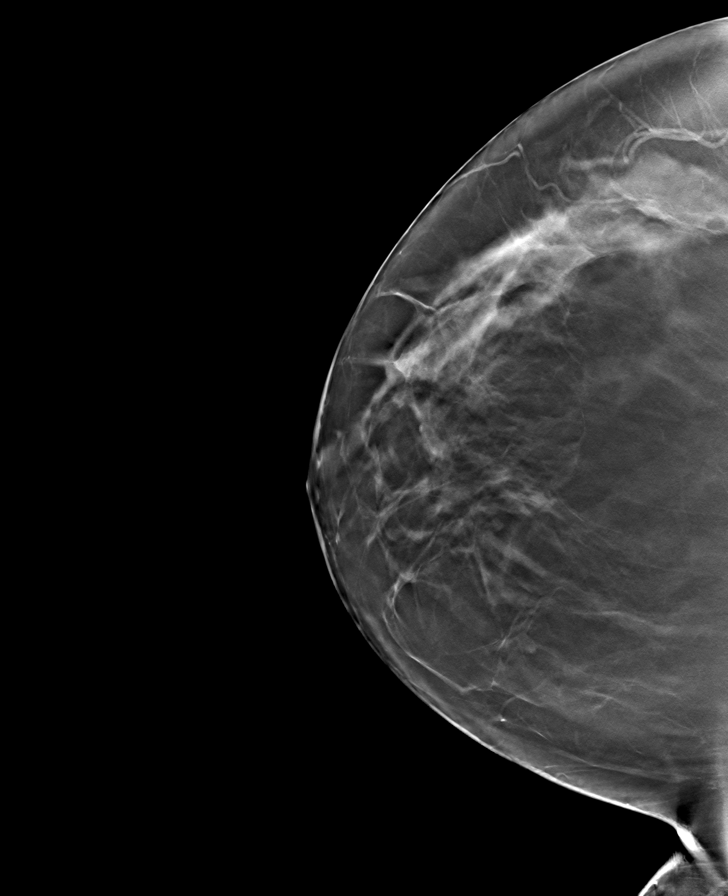

[L MLO tomo · tomo slice 49/97.0]
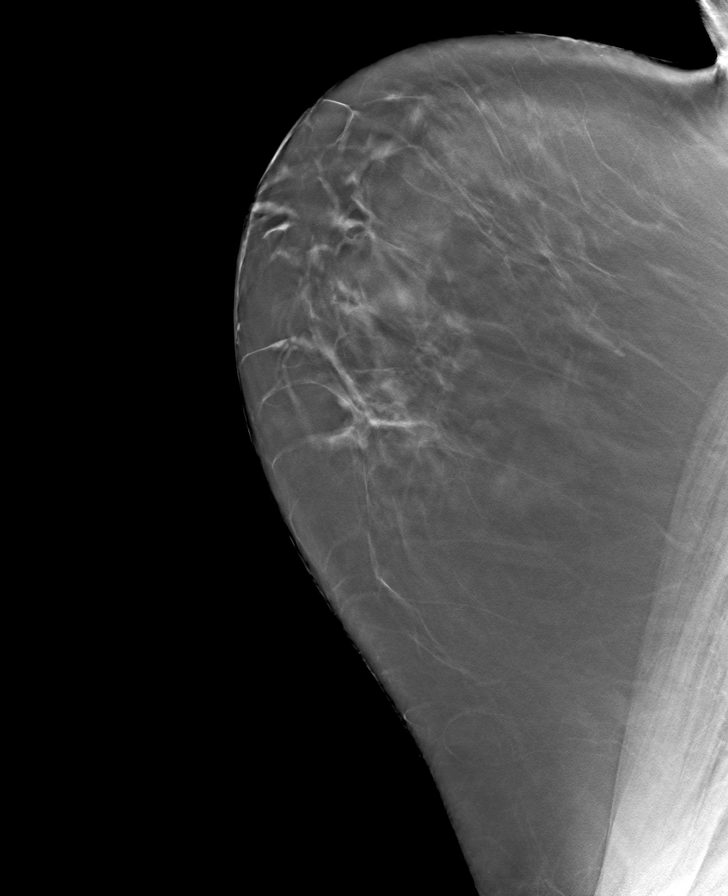

[8 of 24 positions shown; findings below may reference images not displayed]

ACR Breast Density Category b: There are scattered areas of
fibroglandular density.
FINDINGS: There are no findings suspicious for malignancy.
IMPRESSION: No mammographic evidence of malignancy. A result letter of this
screening mammogram will be mailed directly to the patient.

RECOMMENDATION:
Screening mammogram in one year. (Code:51-O-LD2)

BI-RADS CATEGORY  1: Negative.

## 2023-02-08 ENCOUNTER — Encounter (HOSPITAL_BASED_OUTPATIENT_CLINIC_OR_DEPARTMENT_OTHER): Payer: Self-pay

## 2023-02-08 ENCOUNTER — Other Ambulatory Visit: Payer: Self-pay

## 2023-02-08 DIAGNOSIS — Z794 Long term (current) use of insulin: Secondary | ICD-10-CM | POA: Insufficient documentation

## 2023-02-08 DIAGNOSIS — Z20822 Contact with and (suspected) exposure to covid-19: Secondary | ICD-10-CM | POA: Diagnosis not present

## 2023-02-08 DIAGNOSIS — E119 Type 2 diabetes mellitus without complications: Secondary | ICD-10-CM | POA: Diagnosis not present

## 2023-02-08 DIAGNOSIS — Z79899 Other long term (current) drug therapy: Secondary | ICD-10-CM | POA: Diagnosis not present

## 2023-02-08 DIAGNOSIS — R070 Pain in throat: Secondary | ICD-10-CM | POA: Insufficient documentation

## 2023-02-08 DIAGNOSIS — R21 Rash and other nonspecific skin eruption: Secondary | ICD-10-CM | POA: Diagnosis not present

## 2023-02-08 DIAGNOSIS — I10 Essential (primary) hypertension: Secondary | ICD-10-CM | POA: Diagnosis not present

## 2023-02-08 DIAGNOSIS — R112 Nausea with vomiting, unspecified: Secondary | ICD-10-CM | POA: Diagnosis present

## 2023-02-08 DIAGNOSIS — R197 Diarrhea, unspecified: Secondary | ICD-10-CM | POA: Insufficient documentation

## 2023-02-08 LAB — BASIC METABOLIC PANEL
Anion gap: 13 (ref 5–15)
BUN: 14 mg/dL (ref 6–20)
CO2: 23 mmol/L (ref 22–32)
Calcium: 9.9 mg/dL (ref 8.9–10.3)
Chloride: 100 mmol/L (ref 98–111)
Creatinine, Ser: 0.52 mg/dL (ref 0.44–1.00)
GFR, Estimated: >60 mL/min (ref >60)
Glucose, Bld: 92 mg/dL (ref 70–99)
Potassium: 4 mmol/L (ref 3.5–5.1)
Sodium: 136 mmol/L (ref 135–145)

## 2023-02-08 LAB — CBC
HCT: 46.2 % — ABNORMAL HIGH (ref 36.0–46.0)
Hemoglobin: 16 g/dL — ABNORMAL HIGH (ref 12.0–15.0)
MCH: 30 pg (ref 26.0–34.0)
MCHC: 34.6 g/dL (ref 30.0–36.0)
MCV: 86.7 fL (ref 80.0–100.0)
Platelets: 297 10*3/uL (ref 150–400)
RBC: 5.33 MIL/uL — ABNORMAL HIGH (ref 3.87–5.11)
RDW: 12.1 % (ref 11.5–15.5)
WBC: 10.2 10*3/uL (ref 4.0–10.5)
nRBC: 0 % (ref 0.0–0.2)

## 2023-02-08 LAB — LIPASE, BLOOD: Lipase: 30 U/L (ref 11–51)

## 2023-02-08 NOTE — ED Triage Notes (Signed)
 Pt reports she started taking a new drug called Tirzapatide ( 2 months ago) for her diabetes . Pt reports she increase her dose Sunday. Pt reports since Sunday she developed localized rash on her face, N&V&D , indigestion. Pt airway intact. Pt denies any sob,cp at this time. Pt reports feels generalized weakness.

## 2023-02-09 ENCOUNTER — Emergency Department (HOSPITAL_BASED_OUTPATIENT_CLINIC_OR_DEPARTMENT_OTHER)
Admission: EM | Admit: 2023-02-09 | Discharge: 2023-02-09 | Disposition: A | Discharge status: Home / Self Care | Payer: BC Managed Care – PPO | Attending: Emergency Medicine | Admitting: Emergency Medicine

## 2023-02-09 DIAGNOSIS — R112 Nausea with vomiting, unspecified: Secondary | ICD-10-CM

## 2023-02-09 LAB — RESP PANEL BY RT-PCR (RSV, FLU A&B, COVID)  RVPGX2
Influenza A by PCR: NEGATIVE
Influenza B by PCR: NEGATIVE
Resp Syncytial Virus by PCR: NEGATIVE
SARS Coronavirus 2 by RT PCR: NEGATIVE

## 2023-02-09 MED ORDER — ONDANSETRON 8 MG PO TBDP: ORAL_TABLET | ORAL | 0 refills | Status: DC

## 2023-02-09 MED ORDER — LIDOCAINE VISCOUS HCL 2 % MT SOLN
15.0000 mL | Freq: Once | OROMUCOSAL | Status: AC
  Administered 2023-02-09: 15 mL via ORAL
  Filled 2023-02-09: qty 15

## 2023-02-09 MED ORDER — KETOROLAC TROMETHAMINE 30 MG/ML IJ SOLN
30.0000 mg | Freq: Once | INTRAMUSCULAR | Status: AC
  Administered 2023-02-09: 30 mg via INTRAVENOUS
  Filled 2023-02-09: qty 1

## 2023-02-09 MED ORDER — ONDANSETRON HCL 4 MG/2ML IJ SOLN
4.0000 mg | Freq: Once | INTRAMUSCULAR | Status: AC
  Administered 2023-02-09: 4 mg via INTRAVENOUS
  Filled 2023-02-09: qty 2

## 2023-02-09 MED ORDER — ALUM & MAG HYDROXIDE-SIMETH 200-200-20 MG/5ML PO SUSP
30.0000 mL | Freq: Once | ORAL | Status: AC
  Administered 2023-02-09: 30 mL via ORAL
  Filled 2023-02-09: qty 30

## 2023-02-09 MED ORDER — SODIUM CHLORIDE 0.9 % IV BOLUS
1000.0000 mL | Freq: Once | INTRAVENOUS | Status: AC
  Administered 2023-02-09: 1000 mL via INTRAVENOUS

## 2023-02-09 NOTE — Discharge Instructions (Signed)
 Begin taking Zofran  as prescribed as needed for nausea.  Clear liquids for the next 12 hours, then slowly advance diet as tolerated.  Begin taking Prilosec 20 mg once daily for the next several weeks.  Begin taking Benadryl 25 mg every 6 hours as needed for rash or itching.  Return to the ER if your symptoms significantly worsen or change.

## 2023-02-09 NOTE — ED Provider Notes (Signed)
 [place] EMERGENCY DEPARTMENT AT [facility] Provider Note   CSN: [id number] Arrival date & time: 02/08/23  2047     History  Chief Complaint  Patient presents with   Allergic Reaction    Cheryl Mayer is a 58 y.o. female.  Patient is a 58 year old female with past medical history of hypertension, type 2 diabetes.  Patient presenting today with complaints of nausea, vomiting, diarrhea for the past 2 days.  She is reporting a burning sensation in her throat that makes it difficult for her to eat or drink.  All has been nonbloody.  No fevers or chills.  No ill contacts.  No aggravating or alleviating factors.  She also describes facial rash since seeing her dose of Tirzapatide and is concerned she may be having a reaction to this.  The history is provided by the patient.       Home Medications Prior to Admission medications   Medication Sig Start Date End Date Taking? Authorizing Provider  acetaminophen  (TYLENOL ) 325 MG tablet Take 650 mg by mouth every 6 (six) hours as needed for mild pain.     [provider]  amLODipine (NORVASC) 5 MG tablet Take by mouth. 04/04/20   [provider]  amoxicillin -clavulanate (AUGMENTIN ) 875-125 MG tablet Take 1 tablet by mouth 2 (two) times daily. 02/17/22   Vivienne Delon HERO, PA-C  aspirin-acetaminophen -caffeine (EXCEDRIN MIGRAINE) 250-250-65 MG tablet Take 1 tablet by mouth every 6 (six) hours as needed for headache.    [provider]  MOUNJARO 10 MG/0.5ML Pen SMARTSIG:10 Milligram(s) SUB-Q Once a Week 11/21/20   [provider]  ondansetron  (ZOFRAN  ODT) 4 MG disintegrating tablet Take 1 tablet (4 mg total) by mouth every 8 (eight) hours as needed for nausea or vomiting. 12/16/20   Landy Honora CROME, PA-C  tretinoin (RETIN-A) 0.05 % cream SMARTSIG:1 Application Topical Every Evening 01/14/21   [provider]      Allergies    Desvenlafaxine    Review of Systems   Review of Systems   All other systems reviewed and are negative.   Physical Exam Updated Vital Signs BP (!) 171/113 (BP Location: Left Arm)   Pulse 99   Temp 98.1 F (36.7 C)   Resp 20   LMP  (LMP Unknown)   SpO2 100%  Physical Exam Vitals and nursing note reviewed.  Constitutional:      General: She is not in acute distress.    Appearance: She is well-developed. She is not diaphoretic.  HENT:     Head: Normocephalic and atraumatic.  Cardiovascular:     Rate and Rhythm: Normal rate and regular rhythm.     Heart sounds: No murmur heard.    No friction rub. No gallop.  Pulmonary:     Effort: Pulmonary effort is normal. No respiratory distress.     Breath sounds: Normal breath sounds. No wheezing.  Abdominal:     General: Bowel sounds are normal. There is no distension.     Palpations: Abdomen is soft.     Tenderness: There is no abdominal tenderness.  Musculoskeletal:        General: Normal range of motion.     Cervical back: Normal range of motion and neck supple.  Skin:    General: Skin is warm and dry.     Comments: There is some erythema noted to the cheeks and face extending into the neck.  Neurological:     General: No focal deficit present.  Mental Status: She is alert and oriented to person, place, and time.     ED Results / Procedures / Treatments   Labs (all labs ordered are listed, but only abnormal results are displayed) Labs Reviewed  CBC - Abnormal; Notable for the following components:      Result Value   RBC 5.33 (*)    Hemoglobin 16.0 (*)    HCT 46.2 (*)    All other components within normal limits  RESP PANEL BY RT-PCR (RSV, FLU A&B, COVID)  [id number]  BASIC METABOLIC PANEL  LIPASE, BLOOD    EKG None  Radiology No results found.  Procedures Procedures    Medications Ordered in ED Medications  ondansetron  (ZOFRAN ) injection 4 mg (has no administration in time range)  ketorolac  (TORADOL ) 30 MG/ML injection 30 mg (has no administration in time range)   sodium chloride  0.9 % bolus 1,000 mL (has no administration in time range)  alum & mag hydroxide-simeth (MAALOX/MYLANTA) 200-200-20 MG/5ML suspension 30 mL (has no administration in time range)    And  lidocaine  (XYLOCAINE ) 2 % viscous mouth solution 15 mL (has no administration in time range)    ED Course/ Medical Decision Making/ A&P  Patient presenting with nausea, vomiting, and diarrhea and a burning sensation in her throat.  She also describes facial rash.  She arrives here with stable vital signs and is clinically well-appearing.  Abdomen is benign.  Laboratory studies obtained including CBC, metabolic panel, and lipase, all of which are unremarkable.  Respiratory panel negative for COVID, flu, and RSV.  Patient has been hydrated with normal saline and has received Toradol  for pain and Zofran  for nausea.  She seems to be feeling improved and I believe can safely be discharged.  Her symptoms are most likely viral in nature.  She will be given Zofran  for her nausea.  I will also advise that she take Benadryl for the facial rash.  Final Clinical Impression(s) / ED Diagnoses Final diagnoses:  None    Rx / DC Orders ED Discharge Orders     None         Geroldine Berg, MD 02/09/23 [phone]

## 2023-04-29 ENCOUNTER — Ambulatory Visit
Admission: RE | Admit: 2023-04-29 | Discharge: 2023-04-29 | Disposition: A | Discharge status: Home / Self Care | Source: Ambulatory Visit | Attending: Family Medicine | Admitting: Family Medicine

## 2023-04-29 ENCOUNTER — Other Ambulatory Visit: Payer: Self-pay | Admitting: Family Medicine

## 2023-04-29 DIAGNOSIS — R1011 Right upper quadrant pain: Secondary | ICD-10-CM

## 2023-06-30 ENCOUNTER — Ambulatory Visit
Admission: EM | Admit: 2023-06-30 | Discharge: 2023-06-30 | Disposition: A | Discharge status: Home / Self Care | Attending: Nurse Practitioner | Admitting: Nurse Practitioner

## 2023-06-30 ENCOUNTER — Encounter: Payer: Self-pay | Admitting: Emergency Medicine

## 2023-06-30 DIAGNOSIS — J0110 Acute frontal sinusitis, unspecified: Secondary | ICD-10-CM

## 2023-06-30 LAB — POC SARS CORONAVIRUS 2 AG -  ED: SARS Coronavirus 2 Ag: NEGATIVE

## 2023-06-30 LAB — POCT RAPID STREP A (OFFICE): Rapid Strep A Screen: NEGATIVE

## 2023-06-30 MED ORDER — FLUTICASONE PROPIONATE 50 MCG/ACT NA SUSP: 2.0000 | Freq: Every day | NASAL | 0 refills | Status: AC

## 2023-06-30 MED ORDER — PSEUDOEPH-BROMPHEN-DM 30-2-10 MG/5ML PO SYRP: 5.0000 mL | ORAL_SOLUTION | Freq: Four times a day (QID) | ORAL | 0 refills | Status: AC | PRN

## 2023-06-30 MED ORDER — AMOXICILLIN-POT CLAVULANATE 875-125 MG PO TABS: 1.0000 | ORAL_TABLET | Freq: Two times a day (BID) | ORAL | 0 refills | Status: AC

## 2023-06-30 NOTE — Discharge Instructions (Signed)
 The rapid strep test and COVID test were negative. Take medication as prescribed. Increase fluids allow for plenty of rest. You may take over-the-counter Tylenol  or ibuprofen as needed for pain, fever, or general discomfort. Recommend use of normal saline nasal spray throughout the day for nasal congestion and runny nose. For your cough and congestion, it may be helpful to use a humidifier at nighttime during sleep and to sleep elevated on pillows while symptoms persist. If symptoms fail to improve with this treatment, you may follow-up in this clinic or with your primary care physician for further evaluation. Follow-up as needed.

## 2023-06-30 NOTE — ED Provider Notes (Signed)
 [facility]    CSN: [id number] Arrival date & time: 06/30/23  1646      History   Chief Complaint No chief complaint on file.   HPI Cheryl Mayer is a 58 y.o. female.   The history is provided by the patient.   Patient presents with a 5-day history of nasal congestion, headache, bilateral ear pressure, sinus pressure, and cough.  Patient denies fever, chills, wheezing, difficulty breathing, chest pain, abdominal pain, nausea, vomiting, diarrhea, or rash.  Patient denies any obvious known sick contacts, although she does work with the public.  States that she has been using several over-the-counter cough and cold medications for her symptoms with minimal relief.  Past Medical History:  Diagnosis Date   Anemia    Depression    Gestational diabetes    HTN (hypertension)    Migraines    Obesity    Pain in female genitalia on intercourse    Syncope     Patient Active Problem List   Diagnosis Date Noted   Back pain 06/24/2020   Neck pain 06/24/2020   Symptomatic mammary hypertrophy 06/24/2020   Chest discomfort 08/09/2017   Bruit of left carotid artery 06/28/2017   Essential hypertension 06/22/2017   Leukocytosis 06/22/2017   AKI (acute kidney injury) (HCC) 06/22/2017   Dyspnea 06/22/2017   Syncope 06/21/2017   Benign neoplasm of skin of face 03/07/2017   Left shoulder pain 08/11/2016   Benign skin lesion 05/24/2015   Rosacea 05/24/2015   Attention deficit disorder of adult 08/02/2014   Depression 08/02/2014   Dyshidrotic eczema 08/02/2014   Migraines 08/02/2014   Overweight (BMI 25.0-29.9) 08/02/2014   Symptomatic menopausal or female climacteric states 08/02/2014    Past Surgical History:  Procedure Laterality Date   ABDOMINAL HYSTERECTOMY     DILATION AND CURETTAGE OF UTERUS     x2   LAPAROSCOPIC VAGINAL HYSTERECTOMY     REDUCTION MAMMAPLASTY Bilateral 01/08/2021    OB History     Gravida  8   Para  6   Term  3   Preterm  0    AB  2   Living  3      SAB  2   IAB  0   Ectopic  0   Multiple  0   Live Births  3            Home Medications    Prior to Admission medications   Medication Sig Start Date End Date Taking? Authorizing Provider  amoxicillin -clavulanate (AUGMENTIN ) 875-125 MG tablet Take 1 tablet by mouth every 12 (twelve) hours. 06/30/23  Yes Leath-Warren, Belen Bowers, NP  brompheniramine-pseudoephedrine-DM 30-2-10 MG/5ML syrup Take 5 mLs by mouth 4 (four) times daily as needed. 06/30/23  Yes Leath-Warren, Belen Bowers, NP  fluticasone  (FLONASE ) 50 MCG/ACT nasal spray Place 2 sprays into both nostrils daily. 06/30/23  Yes Leath-Warren, Belen Bowers, NP    Family History Family History  Problem Relation Age of Onset   Breast cancer Paternal Grandmother 62   Diabetes Mother    Heart attack Mother    Stroke Mother    Heart attack Father    Diabetes Father    CAD Father    Diabetes Sister    Breast cancer Paternal Aunt     Social History Social History   Tobacco Use   Smoking status: Never   Smokeless tobacco: Never  Vaping Use   Vaping status: Never Used  Substance Use Topics   Alcohol use:  No   Drug use: Never     Allergies   Desvenlafaxine   Review of Systems Review of Systems Per HPI  Physical Exam Triage Vital Signs ED Triage Vitals  Encounter Vitals Group     BP 06/30/23 1658 130/82     Systolic BP Percentile --      Diastolic BP Percentile --      Pulse Rate 06/30/23 1658 74     Resp 06/30/23 1658 18     Temp 06/30/23 1658 97.9 F (36.6 C)     Temp Source 06/30/23 1658 Oral     SpO2 06/30/23 1658 97 %     Weight --      Height --      Head Circumference --      Peak Flow --      Pain Score 06/30/23 1659 3     Pain Loc --      Pain Education --      Exclude from Growth Chart --    No data found.  Updated Vital Signs BP 130/82 (BP Location: Right Arm)   Pulse 74   Temp 97.9 F (36.6 C) (Oral)   Resp 18   LMP  (LMP Unknown)   SpO2 97%    Visual Acuity Right Eye Distance:   Left Eye Distance:   Bilateral Distance:    Right Eye Near:   Left Eye Near:    Bilateral Near:     Physical Exam Vitals and nursing note reviewed.  Constitutional:      General: She is not in acute distress.    Appearance: Normal appearance.  HENT:     Head: Normocephalic.     Right Ear: Tympanic membrane, ear canal and external ear normal.     Left Ear: Tympanic membrane, ear canal and external ear normal.     Nose: Congestion present.     Right Turbinates: Enlarged and swollen.     Left Turbinates: Enlarged and swollen.     Right Sinus: Frontal sinus tenderness present. No maxillary sinus tenderness.     Left Sinus: Frontal sinus tenderness present. No maxillary sinus tenderness.     Mouth/Throat:     Lips: Pink.     Mouth: Mucous membranes are moist.     Pharynx: Uvula midline. Posterior oropharyngeal erythema and postnasal drip present. No pharyngeal swelling, oropharyngeal exudate or uvula swelling.     Comments: Cobblestoning present to posterior oropharynx  Eyes:     Extraocular Movements: Extraocular movements intact.     Conjunctiva/sclera: Conjunctivae normal.     Pupils: Pupils are equal, round, and reactive to light.  Cardiovascular:     Rate and Rhythm: Normal rate and regular rhythm.     Pulses: Normal pulses.     Heart sounds: Normal heart sounds.  Pulmonary:     Effort: Pulmonary effort is normal. No respiratory distress.     Breath sounds: Normal breath sounds. No stridor. No wheezing, rhonchi or rales.  Abdominal:     General: Bowel sounds are normal.     Palpations: Abdomen is soft.     Tenderness: There is no abdominal tenderness.  Musculoskeletal:     Cervical back: Normal range of motion.  Lymphadenopathy:     Cervical: No cervical adenopathy.  Skin:    General: Skin is warm and dry.  Neurological:     General: No focal deficit present.     Mental Status: She is alert and oriented to person, place, and  time.  Psychiatric:        Mood and Affect: Mood normal.        Behavior: Behavior normal.      UC Treatments / Results  Labs (all labs ordered are listed, but only abnormal results are displayed) Labs Reviewed  POCT RAPID STREP A (OFFICE) - Normal  POC SARS CORONAVIRUS 2 AG -  ED    EKG   Radiology No results found.  Procedures Procedures (including critical care time)  Medications Ordered in UC Medications - No data to display  Initial Impression / Assessment and Plan / UC Course  I have reviewed the triage vital signs and the nursing notes.  Pertinent labs & imaging results that were available during my care of the patient were reviewed by me and considered in my medical decision making (see chart for details).  The rapid strep test and COVID test were negative.  On exam, patient has moderate nasal congestion and frontal sinus tenderness.  Symptoms are consistent with acute frontal sinusitis.  Will treat empirically with Augmentin  875/125 mg tablets twice daily for the next 7 days along with fluticasone  50 mcg nasal spray.  Bromfed-DM prescribed for cough.  Supportive care recommendations were provided and discussed with the patient to include fluids, rest, over-the-counter analgesics, and normal saline nasal spray.  Patient also advised to use a humidifier at nighttime during sleep.  Discussed indications with patient regarding follow-up.  Patient was in agreement with this plan of care and verbalizes understanding.  All questions were answered.  Patient stable for discharge.  Final Clinical Impressions(s) / UC Diagnoses   Final diagnoses:  Acute frontal sinusitis, recurrence not specified     Discharge Instructions      The rapid strep test and COVID test were negative. Take medication as prescribed. Increase fluids allow for plenty of rest. You may take over-the-counter Tylenol  or ibuprofen as needed for pain, fever, or general discomfort. Recommend use of  normal saline nasal spray throughout the day for nasal congestion and runny nose. For your cough and congestion, it may be helpful to use a humidifier at nighttime during sleep and to sleep elevated on pillows while symptoms persist. If symptoms fail to improve with this treatment, you may follow-up in this clinic or with your primary care physician for further evaluation. Follow-up as needed.   ED Prescriptions     Medication Sig Dispense Auth. Provider   amoxicillin -clavulanate (AUGMENTIN ) 875-125 MG tablet Take 1 tablet by mouth every 12 (twelve) hours. 14 tablet Leath-Warren, Belen Bowers, NP   fluticasone  (FLONASE ) 50 MCG/ACT nasal spray Place 2 sprays into both nostrils daily. 16 g Leath-Warren, Belen Bowers, NP   brompheniramine-pseudoephedrine-DM 30-2-10 MG/5ML syrup Take 5 mLs by mouth 4 (four) times daily as needed. 140 mL Leath-Warren, Belen Bowers, NP      PDMP not reviewed this encounter.   Hardy Lia, NP 06/30/23 [phone]

## 2023-06-30 NOTE — ED Triage Notes (Signed)
 Sore throat, congestion, ear pain since Saturday.  Has been taking dayquil.

## 2023-07-25 ENCOUNTER — Other Ambulatory Visit: Payer: Self-pay | Admitting: Family Medicine

## 2023-07-25 DIAGNOSIS — R17 Unspecified jaundice: Secondary | ICD-10-CM

## 2023-07-25 DIAGNOSIS — R1011 Right upper quadrant pain: Secondary | ICD-10-CM

## 2023-08-04 ENCOUNTER — Ambulatory Visit
Admission: RE | Admit: 2023-08-04 | Discharge: 2023-08-04 | Disposition: A | Discharge status: Home / Self Care | Payer: Self-pay | Source: Ambulatory Visit | Attending: Family Medicine | Admitting: Family Medicine

## 2023-08-04 ENCOUNTER — Other Ambulatory Visit: Payer: Self-pay | Admitting: Family Medicine

## 2023-08-04 DIAGNOSIS — R17 Unspecified jaundice: Secondary | ICD-10-CM | POA: Insufficient documentation

## 2023-08-04 DIAGNOSIS — R1011 Right upper quadrant pain: Secondary | ICD-10-CM | POA: Insufficient documentation

## 2023-08-04 MED ORDER — GADOBUTROL 1 MMOL/ML IV SOLN
7.0000 mL | Freq: Once | INTRAVENOUS | Status: AC | PRN
  Administered 2023-08-04: 7 mL via INTRAVENOUS

## 2023-10-14 ENCOUNTER — Other Ambulatory Visit: Payer: Self-pay | Admitting: Gastroenterology

## 2023-10-26 ENCOUNTER — Encounter (HOSPITAL_COMMUNITY): Payer: Self-pay | Admitting: Gastroenterology

## 2023-10-28 ENCOUNTER — Encounter (HOSPITAL_COMMUNITY): Payer: Self-pay | Admitting: Gastroenterology

## 2023-10-28 ENCOUNTER — Other Ambulatory Visit: Payer: Self-pay

## 2023-10-28 ENCOUNTER — Encounter (HOSPITAL_COMMUNITY)
Admission: RE | Disposition: A | Discharge status: Home / Self Care | Payer: Self-pay | Source: Home / Self Care | Attending: Gastroenterology

## 2023-10-28 ENCOUNTER — Ambulatory Visit (HOSPITAL_COMMUNITY): Admitting: Anesthesiology

## 2023-10-28 ENCOUNTER — Ambulatory Visit (HOSPITAL_COMMUNITY)
Admission: RE | Admit: 2023-10-28 | Discharge: 2023-10-28 | Disposition: A | Discharge status: Home / Self Care | Attending: Gastroenterology | Admitting: Gastroenterology

## 2023-10-28 DIAGNOSIS — Z833 Family history of diabetes mellitus: Secondary | ICD-10-CM | POA: Insufficient documentation

## 2023-10-28 DIAGNOSIS — E119 Type 2 diabetes mellitus without complications: Secondary | ICD-10-CM | POA: Insufficient documentation

## 2023-10-28 DIAGNOSIS — K828 Other specified diseases of gallbladder: Secondary | ICD-10-CM | POA: Insufficient documentation

## 2023-10-28 DIAGNOSIS — Z7985 Long-term (current) use of injectable non-insulin antidiabetic drugs: Secondary | ICD-10-CM | POA: Insufficient documentation

## 2023-10-28 DIAGNOSIS — R1011 Right upper quadrant pain: Secondary | ICD-10-CM | POA: Diagnosis present

## 2023-10-28 DIAGNOSIS — K838 Other specified diseases of biliary tract: Secondary | ICD-10-CM | POA: Diagnosis not present

## 2023-10-28 DIAGNOSIS — I10 Essential (primary) hypertension: Secondary | ICD-10-CM | POA: Diagnosis not present

## 2023-10-28 DIAGNOSIS — Z8249 Family history of ischemic heart disease and other diseases of the circulatory system: Secondary | ICD-10-CM | POA: Diagnosis not present

## 2023-10-28 HISTORY — PX: EUS: SHX5427

## 2023-10-28 HISTORY — PX: ESOPHAGOGASTRODUODENOSCOPY: SHX5428

## 2023-10-28 SURGERY — EGD (ESOPHAGOGASTRODUODENOSCOPY)
Anesthesia: Monitor Anesthesia Care

## 2023-10-28 MED ORDER — PROPOFOL 10 MG/ML IV BOLUS
INTRAVENOUS | Status: DC | PRN
  Administered 2023-10-28 (×3): 20 mg via INTRAVENOUS

## 2023-10-28 MED ORDER — LIDOCAINE 2% (20 MG/ML) 5 ML SYRINGE
INTRAMUSCULAR | Status: DC | PRN
  Administered 2023-10-28: 40 mg via INTRAVENOUS

## 2023-10-28 MED ORDER — PROPOFOL 500 MG/50ML IV EMUL
INTRAVENOUS | Status: DC | PRN
  Administered 2023-10-28: 180 ug/kg/min via INTRAVENOUS

## 2023-10-28 MED ORDER — SODIUM CHLORIDE 0.9 % IV SOLN: INTRAVENOUS | Status: DC

## 2023-10-28 NOTE — Anesthesia Preprocedure Evaluation (Signed)
 Anesthesia Evaluation  Patient identified by MRN, date of birth, ID band Patient awake    Reviewed: Allergy & Precautions, H&P , NPO status , Patient's Chart, lab work & pertinent test results  Airway Mallampati: II   Neck ROM: full    Dental   Pulmonary shortness of breath   breath sounds clear to auscultation       Cardiovascular hypertension,  Rhythm:regular Rate:Normal     Neuro/Psych  Headaches PSYCHIATRIC DISORDERS  Depression       GI/Hepatic   Endo/Other  diabetes, Type 2    Renal/GU      Musculoskeletal   Abdominal   Peds  Hematology   Anesthesia Other Findings   Reproductive/Obstetrics                              Anesthesia Physical Anesthesia Plan  ASA: 2  Anesthesia Plan: MAC   Post-op Pain Management:    Induction: Intravenous  PONV Risk Score and Plan: 2 and Treatment may vary due to age or medical condition and Propofol  infusion  Airway Management Planned: Nasal Cannula  Additional Equipment:   Intra-op Plan:   Post-operative Plan:   Informed Consent: I have reviewed the patients History and Physical, chart, labs and discussed the procedure including the risks, benefits and alternatives for the proposed anesthesia with the patient or authorized representative who has indicated his/her understanding and acceptance.     Dental advisory given  Plan Discussed with: CRNA, Anesthesiologist and Surgeon  Anesthesia Plan Comments:         Anesthesia Quick Evaluation

## 2023-10-28 NOTE — Anesthesia Postprocedure Evaluation (Signed)
 Anesthesia Post Note  Patient: Malay Fantroy Brownfield  Procedure(s) Performed: ULTRASOUND, UPPER GI TRACT, ENDOSCOPIC EGD (ESOPHAGOGASTRODUODENOSCOPY)     Patient location during evaluation: PACU Anesthesia Type: MAC Level of consciousness: awake and alert Pain management: pain level controlled Vital Signs Assessment: post-procedure vital signs reviewed and stable Respiratory status: spontaneous breathing, nonlabored ventilation, respiratory function stable and patient connected to nasal cannula oxygen Cardiovascular status: stable and blood pressure returned to baseline Postop Assessment: no apparent nausea or vomiting Anesthetic complications: no   No notable events documented.  Last Vitals:  Vitals:   10/28/23 0750 10/28/23 0800  BP: 128/62 131/66  Pulse: 74 61  Resp: 19 12  Temp:    SpO2: 99% 100%    Last Pain:  Vitals:   10/28/23 0800  TempSrc:   PainSc: 0-No pain                 Jenella Craigie S

## 2023-10-28 NOTE — H&P (Signed)
 Cheryl Mayer HPI: This 58 year old white female presents to the office for evaluation of RUQ pain that started in February, this year. The pain lasted 2 weeks. The pain is now in the back on the right side. The severity has reached a 4/10 at its worst. She had an abdominal ultrasound done on April 29, 2023, that revealed no sonographic evidence of acute cholecystitis, the common bile duct was dilated measuring up to 11 mm, concerned for biliary obstruction and recommend further evaluation of MRCP. She had an MRCP done August 04, 2023 that revealed unchanged mild common bile dilatation, the CBD measured up to 1.0 cm in caliber and tapering, smoothly to ampulla without calculus or other obstruction; No cholelithiasis or gallbladder wall thickening was noted; an ERCP was recommended to further evaluate in the above mentioned abnormalities. Her LFT's are within normal limits except for slight elevation of the total bilirubin up to 1.1. There is no acute findings in the abdomen to explain right upper quadrant pain. She has history of chronic constipation and can go 3 days without having a BM. At her baseline, she has 1 BM per day. She had one episode of bright red blood on the toilet tissue and in the stool. She has a good appetite. She has gained 15 pounds over the past 6 months since she stopped the Mounjaro for pre-diabetes.She took Mounjaro for a year and a half because she stopped taking it when her insurance would no longer cover it. She has been on Tirzepatide since then. She denies having any complaints of nausea, vomiting, acid reflux, dysphagia or odynophagia. She denies having a family history of colon cancer, celiac sprue or IBD. Her last colonoscopy done on 03/27/2018 revealed scattered diverticula but was otherwise normal.   Past Medical History:  Diagnosis Date   Anemia    Depression    Gestational diabetes    HTN (hypertension)    Migraines    Obesity    Pain in female genitalia on  intercourse    Syncope     Past Surgical History:  Procedure Laterality Date   ABDOMINAL HYSTERECTOMY     DILATION AND CURETTAGE OF UTERUS     x2   LAPAROSCOPIC VAGINAL HYSTERECTOMY     REDUCTION MAMMAPLASTY Bilateral 01/08/2021    Family History  Problem Relation Age of Onset   Breast cancer Paternal Grandmother 60   Diabetes Mother    Heart attack Mother    Stroke Mother    Heart attack Father    Diabetes Father    CAD Father    Diabetes Sister    Breast cancer Paternal Aunt     Social History:  reports that she has never smoked. She has never used smokeless tobacco. She reports that she does not drink alcohol and does not use drugs.  Allergies:  Allergies  Allergen Reactions   Desvenlafaxine Other (See Comments)    Unknown   Lisinopril     Low blood pressure episode    Medications: Scheduled: Continuous:  sodium chloride       No results found for this or any previous visit (from the past 24 hours).   No results found.  ROS:  As stated above in the HPI otherwise negative.  Blood pressure (!) 147/69, pulse (!) 54, temperature (!) 97.5 F (36.4 C), temperature source Temporal, resp. rate 10, height 5' 4 (1.626 m), weight 71.7 kg, SpO2 100%.    PE: Gen: NAD, Alert and Oriented HEENT:  [place]/AT, EOMI  Neck: Supple, no LAD Lungs: CTA Bilaterally CV: RRR without M/G/R ABD: Soft, NTND, +BS Ext: No C/C/E  Assessment/Plan: 1) Dilated CBD - EUS.  Aloys Hupfer D 10/28/2023, 7:15 AM

## 2023-10-28 NOTE — Op Note (Signed)
 [facility] Patient Name: Cheryl Mayer Procedure Date: 10/28/2023 MRN: [id number] Attending MD: Belvie Just , MD, [id number] Date of Birth: 27-Nov-1965 CSN: [id number] Age: 58 Admit Type: Outpatient Procedure:                Upper EUS Indications:              Abnormal MRCP Providers:                Belvie Just, MD, Randall Lines, RN, Fairy Marina, Technician Referring MD:              Medicines:                Propofol  per Anesthesia Complications:            No immediate complications. Estimated Blood Loss:     Estimated blood loss: none. Procedure:                Pre-Anesthesia Assessment:                           - Prior to the procedure, a History and Physical                            was performed, and patient medications and                            allergies were reviewed. The patient's tolerance of                            previous anesthesia was also reviewed. The risks                            and benefits of the procedure and the sedation                            options and risks were discussed with the patient.                            All questions were answered, and informed consent                            was obtained. Prior Anticoagulants: The patient has                            taken no anticoagulant or antiplatelet agents. ASA                            Grade Assessment: II - A patient with mild systemic                            disease. After reviewing the risks and benefits,  the patient was deemed in satisfactory condition to                            undergo the procedure.                           - Sedation was administered by an anesthesia                            professional. Deep sedation was attained.                           After obtaining informed consent, the endoscope was                            passed under direct vision. Throughout the                             procedure, the patient's blood pressure, pulse, and                            oxygen saturations were monitored continuously. The                            [id number] ([id number] Olympus ultrasound scope was                            introduced through the mouth, and advanced to the                            second part of duodenum. The upper EUS was                            accomplished without difficulty. The patient                            tolerated the procedure well. Scope In: Scope Out: Findings:      ENDOSCOPIC FINDING: :      The examined esophagus was endoscopically normal.      The entire examined stomach was endoscopically normal.      The examined duodenum was endoscopically normal.      ENDOSONOGRAPHIC FINDING: :      A hyperechoic polyp was identified endosonographically in the       gallbladder body.      There was dilation in the common hepatic duct and in the cystic duct       which measured up to 8 mm.      The CBD was mildly dilated proximally and into the hepatic duct at 8 mm.       There was no evidence of any intrahepatic biliary ductal dilation. There       was no evidence of any masses, sludge, or stones in the pancreatic head       or biliary tract. A small 2-3 mm gallbladder polyp was noted. The CBD       tapered down from 8 mm to 3 mm at the ampulla.  Impression:               - Normal esophagus.                           - Normal stomach.                           - Normal examined duodenum.                           - A polyp was found in the gallbladder body.                           - There was dilation in the common hepatic duct and                            in the cystic duct which measured up to 8 mm.                           - No specimens collected. Moderate Sedation:      Not Applicable - Patient had care per Anesthesia. Recommendation:           - Patient has a contact number available for                             emergencies. The signs and symptoms of potential                            delayed complications were discussed with the                            patient. Return to normal activities tomorrow.                            Written discharge instructions were provided to the                            patient.                           - Resume regular diet.                           - Follow up with Dr. Kristie PRN. Procedure Code(s):        --- Professional ---                           [phone], Esophagogastroduodenoscopy, flexible,                            transoral; with endoscopic ultrasound examination                            limited to the esophagus, stomach or duodenum, and  adjacent structures Diagnosis Code(s):        --- Professional ---                           K83.8, Other specified diseases of biliary tract                           K82.8, Other specified diseases of gallbladder                           R93.2, Abnormal findings on diagnostic imaging of                            liver and biliary tract CPT copyright 2022 American Medical Association. All rights reserved. The codes documented in this report are preliminary and upon coder review may  be revised to meet current compliance requirements. Belvie Just, MD Belvie Just, MD 10/28/2023 7:53:16 AM This report has been signed electronically. Number of Addenda: 0

## 2023-10-28 NOTE — Transfer of Care (Signed)
 Immediate Anesthesia Transfer of Care Note  Patient: Cheryl Mayer  Procedure(s) Performed: ULTRASOUND, UPPER GI TRACT, ENDOSCOPIC EGD (ESOPHAGOGASTRODUODENOSCOPY)  Patient Location: Endoscopy Unit  Anesthesia Type:MAC  Level of Consciousness: sedated  Airway & Oxygen Therapy: Patient Spontanous Breathing and Patient connected to face mask oxygen  Post-op Assessment: Report given to RN and Post -op Vital signs reviewed and stable  Post vital signs: Reviewed and stable  Last Vitals:  Vitals Value Taken Time  BP    Temp    Pulse    Resp    SpO2      Last Pain:  Vitals:   10/28/23 0649  TempSrc: Temporal  PainSc: 0-No pain         Complications: No notable events documented.

## 2023-10-28 NOTE — Discharge Instructions (Signed)
YOU HAD AN ENDOSCOPIC PROCEDURE TODAY: Refer to the procedure report and other information in the discharge instructions given to you for any specific questions about what was found during the examination. If this information does not answer your questions, please call [facility] at [phone] to clarify.   YOU SHOULD EXPECT: Some feelings of bloating in the abdomen. Passage of more gas than usual. Walking can help get rid of the air that was put into your GI tract during the procedure and reduce the bloating. Some abdominal soreness may be present for a day or two, also.  DIET: Your first meal following the procedure should be a light meal and then it is ok to progress to your normal diet. A half-sandwich or bowl of soup is an example of a good first meal. Heavy or fried foods are harder to digest and may make you feel nauseous or bloated. Drink plenty of fluids but you should avoid alcoholic beverages for 24 hours.   ACTIVITY: Your care partner should take you home directly after the procedure. You should plan to take it easy, moving slowly for the rest of the day. You can resume normal activity the day after the procedure however YOU SHOULD NOT DRIVE, use power tools, machinery or perform tasks that involve climbing or major physical exertion for 24 hours (because of the sedation medicines used during the test).   SYMPTOMS TO REPORT IMMEDIATELY: A gastroenterologist can be reached at any hour. Please call [phone]  for any of the following symptoms:   Following upper endoscopy (EGD, EUS, ERCP, esophageal dilation) Vomiting of blood or coffee ground material  New, significant abdominal pain  New, significant chest pain or pain under the shoulder blades  Painful or persistently difficult swallowing  New shortness of breath  Black, tarry-looking or red, bloody stools  FOLLOW UP:  If any biopsies were taken you will be contacted by phone or by letter within the next 1-3 weeks. Call  [phone]  if you have not heard about the biopsies in 3 weeks.  Please also call with any specific questions about appointments or follow up tests.  

## 2023-10-31 ENCOUNTER — Encounter (HOSPITAL_COMMUNITY): Payer: Self-pay | Admitting: Gastroenterology

## 2023-11-02 ENCOUNTER — Ambulatory Visit
Admission: RE | Admit: 2023-11-02 | Discharge: 2023-11-02 | Disposition: A | Discharge status: Home / Self Care | Source: Ambulatory Visit | Attending: Family Medicine | Admitting: Family Medicine

## 2023-11-02 ENCOUNTER — Other Ambulatory Visit: Payer: Self-pay | Admitting: Family Medicine

## 2023-11-02 DIAGNOSIS — Z1231 Encounter for screening mammogram for malignant neoplasm of breast: Secondary | ICD-10-CM | POA: Insufficient documentation
